# Patient Record
Sex: Male | Born: 1950 | Race: White | Hispanic: No | Smoking: Former smoker
Health system: Southern US, Community
[De-identification: ages and names within clinical notes are randomized; demographics above are authoritative.]

## PROBLEM LIST (undated history)

## (undated) DIAGNOSIS — J449 Chronic obstructive pulmonary disease, unspecified: Secondary | ICD-10-CM

## (undated) DIAGNOSIS — I10 Essential (primary) hypertension: Secondary | ICD-10-CM

## (undated) DIAGNOSIS — I4891 Unspecified atrial fibrillation: Secondary | ICD-10-CM

## (undated) DIAGNOSIS — I499 Cardiac arrhythmia, unspecified: Secondary | ICD-10-CM

## (undated) DIAGNOSIS — R7303 Prediabetes: Secondary | ICD-10-CM

## (undated) DIAGNOSIS — E785 Hyperlipidemia, unspecified: Secondary | ICD-10-CM

## (undated) DIAGNOSIS — I495 Sick sinus syndrome: Secondary | ICD-10-CM

## (undated) DIAGNOSIS — Z950 Presence of cardiac pacemaker: Secondary | ICD-10-CM

## (undated) HISTORY — DX: Hyperlipidemia, unspecified: E78.5

## (undated) HISTORY — PX: OTHER SURGICAL HISTORY: SHX169

## (undated) HISTORY — DX: Sick sinus syndrome: I49.5

## (undated) HISTORY — DX: Essential (primary) hypertension: I10

---

## 2001-11-05 ENCOUNTER — Emergency Department (HOSPITAL_COMMUNITY): Admission: EM | Admit: 2001-11-05 | Discharge: 2001-11-05 | Payer: Self-pay | Admitting: Emergency Medicine

## 2009-09-03 ENCOUNTER — Ambulatory Visit (HOSPITAL_COMMUNITY): Admission: RE | Admit: 2009-09-03 | Discharge: 2009-09-03 | Payer: Self-pay | Admitting: Pediatrics

## 2011-03-02 ENCOUNTER — Other Ambulatory Visit (HOSPITAL_COMMUNITY): Payer: Self-pay | Admitting: Pediatrics

## 2011-03-02 DIAGNOSIS — R52 Pain, unspecified: Secondary | ICD-10-CM

## 2011-03-10 ENCOUNTER — Ambulatory Visit (HOSPITAL_COMMUNITY)
Admission: RE | Admit: 2011-03-10 | Discharge: 2011-03-10 | Disposition: A | Discharge status: Home / Self Care | Payer: 59 | Source: Ambulatory Visit | Attending: Pediatrics | Admitting: Pediatrics

## 2011-03-10 DIAGNOSIS — M25519 Pain in unspecified shoulder: Secondary | ICD-10-CM | POA: Insufficient documentation

## 2011-03-10 DIAGNOSIS — R52 Pain, unspecified: Secondary | ICD-10-CM

## 2011-03-10 DIAGNOSIS — M542 Cervicalgia: Secondary | ICD-10-CM | POA: Insufficient documentation

## 2011-03-10 DIAGNOSIS — M5020 Other cervical disc displacement, unspecified cervical region: Secondary | ICD-10-CM | POA: Insufficient documentation

## 2015-08-13 ENCOUNTER — Telehealth: Payer: Self-pay

## 2015-08-13 NOTE — Telephone Encounter (Signed)
I called pt and he has never had a colonoscopy. Recently had blood in stool. Ov with Walden Field, NP on 08/29/2015 at 8:30 Am.

## 2015-08-13 NOTE — Telephone Encounter (Signed)
Patient received letter from DS to be set up for colonoscopy. Please call him back at [phone]

## 2015-08-29 ENCOUNTER — Other Ambulatory Visit: Payer: Self-pay

## 2015-08-29 ENCOUNTER — Ambulatory Visit (INDEPENDENT_AMBULATORY_CARE_PROVIDER_SITE_OTHER): Payer: Commercial Managed Care - HMO | Admitting: Nurse Practitioner

## 2015-08-29 ENCOUNTER — Encounter: Payer: Self-pay | Admitting: Nurse Practitioner

## 2015-08-29 VITALS — BP 188/91 | HR 89 | Temp 97.9°F | Ht 75.0 in | Wt 220.2 lb

## 2015-08-29 DIAGNOSIS — Z1211 Encounter for screening for malignant neoplasm of colon: Secondary | ICD-10-CM | POA: Diagnosis not present

## 2015-08-29 DIAGNOSIS — K625 Hemorrhage of anus and rectum: Secondary | ICD-10-CM

## 2015-08-29 MED ORDER — NA SULFATE-K SULFATE-MG SULF 17.5-3.13-1.6 GM/177ML PO SOLN: 1.0000 | Freq: Once | ORAL | Status: DC

## 2015-08-29 NOTE — Assessment & Plan Note (Signed)
Patient initially presented for well overdue first-ever screening colonoscopy. However it was noted that he was having rectal bleeding as noted above. We'll proceed with colonoscopy for further evaluation as noted above.

## 2015-08-29 NOTE — Progress Notes (Signed)
Primary Care Physician:  Wende Neighbors, MD Primary Gastroenterologist:  Dr. Oneida Alar  Chief Complaint  Patient presents with  . Colonoscopy    HPI:   65 year old male presents on referral from PCP for screening colonoscopy. PCP notes reviewed. Patient complained of rectal bleeding 3 weeks prior to attempted phone triage and was therefore scheduled for an appointment.  Today he states he has never had a colonoscopy before. Thought he saw rectal bleeding in the summer and started paying attention. Did have bleeding noted on 07/24/15 and 08/16/15. Blood was bright red, on the toilet, minimal to moderate amount. Had hemorrhoids 20 years ago without recurrence since, specifically when having bleeding no symptoms of hemorrhoid flare. Denies abdominal pain, N/V, melena, change in bowel habits. Has a BM daily in the morning, consistent with [place] 4, no straining. Denies fever, chills, unintentional weight loss. Denies chest pain, dyspnea, dizziness, lightheadedness, syncope, near syncope. Denies any other upper or lower GI symptoms.   He is somewhat hypertensive today. Checks his BP twice a day and typically runs 110-140/70-85. States he is alyways hypertensive in the provider office.  No past medical history on file.  No past surgical history on file.  Current Outpatient Prescriptions  Medication Sig Dispense Refill  . aspirin 81 MG tablet Take 81 mg by mouth daily.    . CHROMIUM-CINNAMON PO Take 2,000 mg by mouth 2 (two) times daily.    . Coenzyme Q10 (CO Q 10) 100 MG CAPS Take 100 capsules by mouth daily.    Marland Kitchen losartan (COZAAR) 100 MG tablet Take 100 mg by mouth daily.   0  . lovastatin (MEVACOR) 10 MG tablet 10 mg at bedtime.   0  . Multiple Vitamin (MULTIVITAMIN) capsule Take 1 capsule by mouth daily.    Marland Kitchen UNABLE TO FIND daily. [place] daily     No current facility-administered medications for this visit.    Allergies as of 08/29/2015  . (No Known Allergies)    No  family history on file.  Social History   Social History  . Marital Status: Widowed    Spouse Name: N/A  . Number of Children: N/A  . Years of Education: N/A   Occupational History  . Not on file.   Social History Main Topics  . Smoking status: Former Smoker -- 1.00 packs/day    Types: Cigarettes    Quit date: 08/28/2010  . Smokeless tobacco: Not on file  . Alcohol Use: No  . Drug Use: No  . Sexual Activity: Not on file   Other Topics Concern  . Not on file   Social History Narrative  . No narrative on file    Review of Systems: 10-point ROS negative except as per HPI.    Physical Exam: BP 188/91 mmHg  Pulse 89  Temp(Src) 97.9 F (36.6 C) (Oral)  Ht 6\' 3"  (1.905 m)  Wt 220 lb 3.2 oz (99.882 kg)  BMI 27.52 kg/m2 General:   Alert and oriented. Pleasant and cooperative. Well-nourished and well-developed.  Head:  Normocephalic and atraumatic. Eyes:  Without icterus, sclera clear and conjunctiva pink.  Ears:  Normal auditory acuity. Cardiovascular:  S1, S2 present without murmurs appreciated. Extremities without clubbing or edema. Respiratory:  Clear to auscultation bilaterally. No wheezes, rales, or rhonchi. No distress.  Gastrointestinal:  +BS, soft, non-tender and non-distended. No HSM noted. No guarding or rebound. No masses appreciated.  Rectal:  Deferred  Neurologic:  Alert and oriented x4;  grossly normal neurologically.  Psych:  Alert and cooperative. Normal mood and affect.    08/29/2015 8:57 AM

## 2015-08-29 NOTE — Assessment & Plan Note (Signed)
65 year old man patient who has never had a colonoscopy presents for screening colonoscopy. He has had 2 episodes of mild to moderate hematochezia within the past month. Possibly an episode earlier in the summer as well. At this point he is well overdue for colonoscopy and we'll proceed with colonoscopy for further evaluation. Otherwise, he is asymptomatic from a GI standpoint. No red flag/warning signs or symptoms.  Proceed with colonoscopy with Dr. Oneida Alar in the near future. The risks, benefits, and alternatives have been discussed in detail with the patient. They state understanding and desire to proceed.   Patient is not on any anticoagulants, anxiolytics, chronic pain medications, or antidepressants. Conscious sedation should be adequate for his procedure.

## 2015-08-29 NOTE — Patient Instructions (Signed)
1. We will schedule your procedure for you. 2. Further recommendations to be based on the results of your procedure. 3. Return for follow-up as needed for any stomach or colon issues.

## 2015-08-29 NOTE — Progress Notes (Signed)
CC'ED TO PCP 

## 2015-09-16 ENCOUNTER — Ambulatory Visit (HOSPITAL_COMMUNITY)
Admission: RE | Admit: 2015-09-16 | Discharge: 2015-09-16 | Disposition: A | Discharge status: Home / Self Care | Payer: Commercial Managed Care - HMO | Source: Ambulatory Visit | Attending: Gastroenterology | Admitting: Gastroenterology

## 2015-09-16 ENCOUNTER — Encounter (HOSPITAL_COMMUNITY): Payer: Self-pay | Admitting: *Deleted

## 2015-09-16 ENCOUNTER — Encounter (HOSPITAL_COMMUNITY)
Admission: RE | Disposition: A | Discharge status: Home / Self Care | Payer: Self-pay | Source: Ambulatory Visit | Attending: Gastroenterology

## 2015-09-16 DIAGNOSIS — D124 Benign neoplasm of descending colon: Secondary | ICD-10-CM | POA: Diagnosis not present

## 2015-09-16 DIAGNOSIS — E785 Hyperlipidemia, unspecified: Secondary | ICD-10-CM | POA: Diagnosis not present

## 2015-09-16 DIAGNOSIS — D127 Benign neoplasm of rectosigmoid junction: Secondary | ICD-10-CM | POA: Insufficient documentation

## 2015-09-16 DIAGNOSIS — K5730 Diverticulosis of large intestine without perforation or abscess without bleeding: Secondary | ICD-10-CM | POA: Diagnosis not present

## 2015-09-16 DIAGNOSIS — Z7982 Long term (current) use of aspirin: Secondary | ICD-10-CM | POA: Diagnosis not present

## 2015-09-16 DIAGNOSIS — Z1211 Encounter for screening for malignant neoplasm of colon: Secondary | ICD-10-CM

## 2015-09-16 DIAGNOSIS — K921 Melena: Secondary | ICD-10-CM | POA: Insufficient documentation

## 2015-09-16 DIAGNOSIS — K625 Hemorrhage of anus and rectum: Secondary | ICD-10-CM | POA: Diagnosis not present

## 2015-09-16 DIAGNOSIS — Z79899 Other long term (current) drug therapy: Secondary | ICD-10-CM | POA: Diagnosis not present

## 2015-09-16 DIAGNOSIS — I10 Essential (primary) hypertension: Secondary | ICD-10-CM | POA: Diagnosis not present

## 2015-09-16 DIAGNOSIS — K648 Other hemorrhoids: Secondary | ICD-10-CM | POA: Insufficient documentation

## 2015-09-16 HISTORY — PX: COLONOSCOPY: SHX5424

## 2015-09-16 SURGERY — COLONOSCOPY
Anesthesia: Moderate Sedation

## 2015-09-16 MED ORDER — MEPERIDINE HCL 100 MG/ML IJ SOLN
INTRAMUSCULAR | Status: AC
  Filled 2015-09-16: qty 2

## 2015-09-16 MED ORDER — SPOT INK MARKER SYRINGE KIT
PACK | SUBMUCOSAL | Status: DC | PRN
  Administered 2015-09-16: 5 mL via SUBMUCOSAL

## 2015-09-16 MED ORDER — SODIUM CHLORIDE 0.9 % IV SOLN
INTRAVENOUS | Status: DC
  Administered 2015-09-16: 12:00:00 via INTRAVENOUS

## 2015-09-16 MED ORDER — MIDAZOLAM HCL 5 MG/5ML IJ SOLN
INTRAMUSCULAR | Status: DC | PRN
  Administered 2015-09-16 (×3): 2 mg via INTRAVENOUS

## 2015-09-16 MED ORDER — MIDAZOLAM HCL 5 MG/5ML IJ SOLN
INTRAMUSCULAR | Status: AC
  Filled 2015-09-16: qty 10

## 2015-09-16 MED ORDER — EPINEPHRINE HCL 0.1 MG/ML IJ SOSY
PREFILLED_SYRINGE | INTRAMUSCULAR | Status: AC
  Filled 2015-09-16: qty 10

## 2015-09-16 MED ORDER — SIMETHICONE 40 MG/0.6ML PO SUSP
ORAL | Status: DC | PRN
  Administered 2015-09-16: 12:00:00

## 2015-09-16 MED ORDER — MEPERIDINE HCL 100 MG/ML IJ SOLN
INTRAMUSCULAR | Status: DC | PRN
  Administered 2015-09-16 (×3): 25 mg via INTRAVENOUS

## 2015-09-16 NOTE — Op Note (Addendum)
[facility] [address] [place], [address]   COLONOSCOPY PROCEDURE REPORT  PATIENT: Ruben Lee, Ruben Lee  MR#: [id number] BIRTHDATE: Nov 07, 1950 , 64  yrs. old GENDER: male ENDOSCOPIST: Danie Binder, MD REFERRED EO:[id number] Hall, M.D. PROCEDURE DATE:  October 06, 2015 PROCEDURE:   Colonoscopy with snare polypectomy and Submucosal injection: EPI(2 CC), SPOT(1 CC) INDICATIONS:hematochezia. MEDICATIONS: Demerol 75 mg IV and Versed 6 mg IV  MD INITIATED: 1212. PROCEDURE COMPLETE: 1251  DESCRIPTION OF PROCEDURE:    Physical exam was performed.  Informed consent was obtained from the patient after explaining the benefits, risks, and alternatives to procedure.  The patient was connected to monitor and placed in left lateral position. Continuous oxygen was provided by nasal cannula and IV medicine administered through an indwelling cannula.  After administration of sedation and rectal exam, the patients rectum was intubated and the [id number]  colonoscope was advanced under direct visualization to the ileum.  The scope was removed slowly by carefully examining the color, texture, anatomy, and integrity mucosa on the way out.  The patient was recovered in endoscopy and discharged home in satisfactory condition. Estimated blood loss is zero unless otherwise noted in this procedure report.    COLON FINDINGS: The examined terminal ileum appeared to be normal. , A sessile polyp measuring 6 mm in size was found in the descending colon.  A polypectomy was performed using snare cautery. , A pedunculated polyp measuring 12 mm in size was found in the rectosigmoid colon.  A polypectomy was performed using snare cautery.  The resection was complete, the polyp tissue was completely retrieved and sent to histology.  A 1:10,000 Epinephrine solution injection was given to control bleeding.  A tattoo was applied.  , There was mild diverticulosis noted in the sigmoid colon with  associated muscular hypertrophy.  , and Large internal hemorrhoids were found.  PREP QUALITY: excellent. CECAL W/D TIME: 19       minutes COMPLICATIONS: None  ENDOSCOPIC IMPRESSION: 1.   RECTAL BLEEDING DUE TO LARGE INTERNAL HEMORRHOIDS 2.   ONESessile DESCENDING COLON polyp REMOVED 3.   ONE Pedunculated RECTOSIGMOID COLON polyp 4.   Mild diverticulosis in the sigmoid colon  RECOMMENDATIONS: NO MRI FOR 30 DAYS. DRINK WATER TO KEEP YOUR URINE LIGHT YELLOW. FOLLOW A HIGH FIBER DIET. AWAIT BIOPSY RESULTS. Next colonoscopy in 1-3 years.   _______________________________ Lorrin MaisDanie Binder, MD 10/06/15 1:26 PM Revised: 10-06-15 1:26 PM  CPT CODES: ICD CODES:  The ICD and CPT codes recommended by this software are interpretations from the data that the clinical staff has captured with the software.  The verification of the translation of this report to the ICD and CPT codes and modifiers is the sole responsibility of the health care institution and practicing physician where this report was generated.  [employer] will not be held responsible for the validity of the ICD and CPT codes included on this report.  AMA assumes no liability for data contained or not contained herein. CPT is a Designer, television/film set of the [employer].

## 2015-09-16 NOTE — Discharge Instructions (Signed)
YOUR RECTAL BLEEDING IS MOST LIKELY DUE TO LARGE INTERNAL HEMORRHOIDS. You had 2 polyps removed. ONE WAS LARGER AND I TATTOOED THE BASE. I PLACED A CLIP TO PREVENT BLEEDING IN 7-10 DAYS. You have Diverticulosis IN YOUR LEFT COLON.   NO MRI FOR 30 DAYS DUE TO METAL CLIPS PLACED IN THE COLON.  DRINK WATER TO KEEP YOUR URINE LIGHT YELLOW.  FOLLOW A HIGH FIBER DIET. AVOID ITEMS THAT CAUSE BLOATING. SEE INFO BELOW.  YOUR BIOPSY RESULTS WILL BE AVAILABLE IN MY CHART FEB 2 AND MY OFFICE WILL CONTACT YOU IN 10-14 DAYS WITH YOUR RESULTS.   Next colonoscopy in 1-3 years.    Colonoscopy Care After Read the instructions outlined below and refer to this sheet in the next week. These discharge instructions provide you with general information on caring for yourself after you leave the hospital. While your treatment has been planned according to the most current medical practices available, unavoidable complications occasionally occur. If you have any problems or questions after discharge, call DR. Elleanna Melling, [phone].  ACTIVITY  You may resume your regular activity, but move at a slower pace for the next 24 hours.   Take frequent rest periods for the next 24 hours.   Walking will help get rid of the air and reduce the bloated feeling in your belly (abdomen).   No driving for 24 hours (because of the medicine (anesthesia) used during the test).   You may shower.   Do not sign any important legal documents or operate any machinery for 24 hours (because of the anesthesia used during the test).    NUTRITION  Drink plenty of fluids.   You may resume your normal diet as instructed by your doctor.   Begin with a light meal and progress to your normal diet. Heavy or fried foods are harder to digest and may make you feel sick to your stomach (nauseated).   Avoid alcoholic beverages for 24 hours or as instructed.    MEDICATIONS  You may resume your normal medications.   WHAT YOU CAN EXPECT  TODAY  Some feelings of bloating in the abdomen.   Passage of more gas than usual.   Spotting of blood in your stool or on the toilet paper  .  IF YOU HAD POLYPS REMOVED DURING THE COLONOSCOPY:  Eat a soft diet IF YOU HAVE NAUSEA, BLOATING, ABDOMINAL PAIN, OR VOMITING.    FINDING OUT THE RESULTS OF YOUR TEST Not all test results are available during your visit. DR. Oneida Alar WILL CALL YOU WITHIN 14 DAYS OF YOUR PROCEDUE WITH YOUR RESULTS. Do not assume everything is normal if you have not heard from DR. Jamesen Stahnke, CALL HER OFFICE AT [phone].  SEEK IMMEDIATE MEDICAL ATTENTION AND CALL THE OFFICE: [phone] IF:  You have more than a spotting of blood in your stool.   Your belly is swollen (abdominal distention).   You are nauseated or vomiting.   You have a temperature over 101F.   You have abdominal pain or discomfort that is severe or gets worse throughout the day.  Polyps, Colon  A polyp is extra tissue that grows inside your body. Colon polyps grow in the large intestine. The large intestine, also called the colon, is part of your digestive system. It is a long, hollow tube at the end of your digestive tract where your body makes and stores stool. Most polyps are not dangerous. They are benign. This means they are not cancerous. But over time, some types of polyps  can turn into cancer. Polyps that are smaller than a pea are usually not harmful. But larger polyps could someday become or may already be cancerous. To be safe, doctors remove all polyps and test them.   WHO GETS POLYPS? Anyone can get polyps, but certain people are more likely than others. You may have a greater chance of getting polyps if:  You are over 50.   You have had polyps before.   Someone in your family has had polyps.   Someone in your family has had cancer of the large intestine.   Find out if someone in your family has had polyps. You may also be more likely to get polyps if you:   Eat a lot  of fatty foods   Smoke   Drink alcohol   Do not exercise  Eat too much   PREVENTION There is not one sure way to prevent polyps. You might be able to lower your risk of getting them if you:  Eat more fruits and vegetables and less fatty food.   Do not smoke.   Avoid alcohol.   Exercise every day.   Lose weight if you are overweight.   Eating more calcium and folate can also lower your risk of getting polyps. Some foods that are rich in calcium are milk, cheese, and broccoli. Some foods that are rich in folate are chickpeas, kidney beans, and spinach.   High-Fiber Diet A high-fiber diet changes your normal diet to include more whole grains, legumes, fruits, and vegetables. Changes in the diet involve replacing refined carbohydrates with unrefined foods. The calorie level of the diet is essentially unchanged. The Dietary Reference Intake (recommended amount) for adult males is 38 grams per day. For adult females, it is 25 grams per day. Pregnant and lactating women should consume 28 grams of fiber per day. Fiber is the intact part of a plant that is not broken down during digestion. Functional fiber is fiber that has been isolated from the plant to provide a beneficial effect in the body. PURPOSE  Increase stool bulk.   Ease and regulate bowel movements.   Lower cholesterol.  REDUCE RISK OF COLON CANCER  INDICATIONS THAT YOU NEED MORE FIBER  Constipation and hemorrhoids.   Uncomplicated diverticulosis (intestine condition) and irritable bowel syndrome.   Weight management.   As a protective measure against hardening of the arteries (atherosclerosis), diabetes, and cancer.   GUIDELINES FOR INCREASING FIBER IN THE DIET  Start adding fiber to the diet slowly. A gradual increase of about 5 more grams (2 slices of whole-wheat bread, 2 servings of most fruits or vegetables, or 1 bowl of high-fiber cereal) per day is best. Too rapid an increase in fiber may result in constipation,  flatulence, and bloating.   Drink enough water and fluids to keep your urine clear or pale yellow. Water, juice, or caffeine-free drinks are recommended. Not drinking enough fluid may cause constipation.   Eat a variety of high-fiber foods rather than one type of fiber.   Try to increase your intake of fiber through using high-fiber foods rather than fiber pills or supplements that contain small amounts of fiber.   The goal is to change the types of food eaten. Do not supplement your present diet with high-fiber foods, but replace foods in your present diet.   INCLUDE A VARIETY OF FIBER SOURCES  Replace refined and processed grains with whole grains, canned fruits with fresh fruits, and incorporate other fiber sources. White rice, white  breads, and most bakery goods contain little or no fiber.   Brown whole-grain rice, buckwheat oats, and many fruits and vegetables are all good sources of fiber. These include: broccoli, Brussels sprouts, cabbage, cauliflower, beets, sweet potatoes, white potatoes (skin on), carrots, tomatoes, eggplant, squash, berries, fresh fruits, and dried fruits.   Cereals appear to be the richest source of fiber. Cereal fiber is found in whole grains and bran. Bran is the fiber-rich outer coat of cereal grain, which is largely removed in refining. In whole-grain cereals, the bran remains. In breakfast cereals, the largest amount of fiber is found in those with "bran" in their names. The fiber content is sometimes indicated on the label.   You may need to include additional fruits and vegetables each day.   In baking, for 1 cup white flour, you may use the following substitutions:   1 cup whole-wheat flour minus 2 tablespoons.   1/2 cup white flour plus 1/2 cup whole-wheat flour.   Diverticulosis Diverticulosis is a common condition that develops when small pouches (diverticula) form in the wall of the colon. The risk of diverticulosis increases with age. It happens  more often in people who eat a low-fiber diet. Most individuals with diverticulosis have no symptoms. Those individuals with symptoms usually experience belly (abdominal) pain, constipation, or loose stools (diarrhea).  HOME CARE INSTRUCTIONS  Increase the amount of fiber in your diet as directed by your caregiver or dietician. This may reduce symptoms of diverticulosis.   Drink at least 6 to 8 glasses of water each day to prevent constipation.   Try not to strain when you have a bowel movement.   Avoiding nuts and seeds to prevent complications is NOT NECESSARY.   FOODS HAVING HIGH FIBER CONTENT INCLUDE:  Fruits. Apple, peach, pear, tangerine, raisins, prunes.   Vegetables. Brussels sprouts, asparagus, broccoli, cabbage, carrot, cauliflower, romaine lettuce, spinach, summer squash, tomato, winter squash, zucchini.   Starchy Vegetables. Baked beans, kidney beans, lima beans, split peas, lentils, potatoes (with skin).   Grains. Whole wheat bread, brown rice, bran flake cereal, plain oatmeal, white rice, shredded wheat, bran muffins.    SEEK IMMEDIATE MEDICAL CARE IF:  You develop increasing pain or severe bloating.   You have an oral temperature above 101F.   You develop vomiting or bowel movements that are bloody or black.   Hemorrhoids Hemorrhoids are dilated (enlarged) veins around the rectum. Sometimes clots will form in the veins. This makes them swollen and painful. These are called thrombosed hemorrhoids. Causes of hemorrhoids include:  Constipation.   Straining to have a bowel movement.   HEAVY LIFTING  HOME CARE INSTRUCTIONS  Eat a well balanced diet and drink 6 to 8 glasses of water every day to avoid constipation. You may also use a bulk laxative.   Avoid straining to have bowel movements.   Keep anal area dry and clean.   Do not use a donut shaped pillow or sit on the toilet for long periods. This increases blood pooling and pain.   Move your bowels when  your body has the urge; this will require less straining and will decrease pain and pressure.

## 2015-09-16 NOTE — H&P (Signed)
  Primary Care Physician:  Wende Neighbors, MD Primary Gastroenterologist:  Dr. Oneida Alar  Pre-Procedure History & Physical: HPI:  Ruben Lee is a 65 y.o. male here for  BRBPR.  Past Medical History  Diagnosis Date  . Hypertension   . Hyperlipidemia     Past Surgical History  Procedure Laterality Date  . None to date      As of 08/29/15    Prior to Admission medications   Medication Sig Start Date End Date Taking? Authorizing Provider  aspirin 81 MG tablet Take 81 mg by mouth daily.   Yes Historical Provider, MD  CHROMIUM-CINNAMON PO Take 2,000 mg by mouth 2 (two) times daily.   Yes Historical Provider, MD  Coenzyme Q10 (CO Q 10) 100 MG CAPS Take 100 capsules by mouth daily.   Yes Historical Provider, MD  ibuprofen (ADVIL,MOTRIN) 200 MG tablet Take 200 mg by mouth every 6 (six) hours as needed.   Yes Historical Provider, MD  losartan (COZAAR) 100 MG tablet Take 100 mg by mouth daily.  08/14/15  Yes Historical Provider, MD  lovastatin (MEVACOR) 10 MG tablet 10 mg at bedtime.  08/14/15  Yes Historical Provider, MD  Multiple Vitamin (MULTIVITAMIN) capsule Take 1 capsule by mouth daily.   Yes Historical Provider, MD  Na Sulfate-K Sulfate-Mg Sulf SOLN Take 1 kit by mouth once. 08/29/15 09/28/15 Yes Carlis Stable, NP  UNABLE TO FIND daily. [place] daily   Yes Historical Provider, MD    Allergies as of 08/29/2015  . (No Known Allergies)    Family History  Problem Relation Age of Onset  . Colon cancer Cousin     "maybe a distant cousin"    Social History   Social History  . Marital Status: Widowed    Spouse Name: N/A  . Number of Children: N/A  . Years of Education: N/A   Occupational History  . Not on file.   Social History Main Topics  . Smoking status: Former Smoker -- 1.00 packs/day for 43 years    Types: Cigarettes    Quit date: 07/18/2010  . Smokeless tobacco: Never Used  . Alcohol Use: No     Comment: None in 20 years  . Drug Use: No  . Sexual  Activity: Not on file   Other Topics Concern  . Not on file   Social History Narrative    Review of Systems: See HPI, otherwise negative ROS   Physical Exam: BP 168/103 mmHg  Pulse 100  Temp(Src) 97.6 F (36.4 C) (Oral)  Resp 13  Ht 6' 3" (1.905 m)  Wt 220 lb (99.791 kg)  BMI 27.50 kg/m2  SpO2 96% General:   Alert,  pleasant and cooperative in NAD Head:  Normocephalic and atraumatic. Neck:  Supple; Lungs:  Clear throughout to auscultation.    Heart:  Regular rate and rhythm. Abdomen:  Soft, nontender and nondistended. Normal bowel sounds, without guarding, and without rebound.   Neurologic:  Alert and  oriented x4;  grossly normal neurologically.  Impression/Plan:    BRBPR  PLAN: TCS TODAY

## 2015-09-16 NOTE — Progress Notes (Signed)
REVIEWED-NO ADDITIONAL RECOMMENDATIONS. 

## 2015-09-18 ENCOUNTER — Encounter (HOSPITAL_COMMUNITY): Payer: Self-pay | Admitting: Gastroenterology

## 2015-09-30 ENCOUNTER — Telehealth: Payer: Self-pay | Admitting: Gastroenterology

## 2015-09-30 NOTE — Telephone Encounter (Signed)
Please call pt. HE had simple adenomas removed.  NO MRI UNTIL MAR 2 DUE TO METAL CLIPS PLACED IN THE COLON.  DRINK WATER TO KEEP YOUR URINE LIGHT YELLOW.  FOLLOW A HIGH FIBER DIET. AVOID ITEMS THAT CAUSE BLOATING.   Next colonoscopy in 3 years.

## 2015-10-01 NOTE — Telephone Encounter (Signed)
Reminder in epic °

## 2015-10-01 NOTE — Telephone Encounter (Signed)
Pt is aware of results. 

## 2016-01-29 ENCOUNTER — Ambulatory Visit (INDEPENDENT_AMBULATORY_CARE_PROVIDER_SITE_OTHER): Payer: Commercial Managed Care - HMO | Admitting: Orthopaedic Surgery

## 2016-01-29 ENCOUNTER — Encounter: Payer: Self-pay | Admitting: Orthopaedic Surgery

## 2016-01-29 ENCOUNTER — Ambulatory Visit (INDEPENDENT_AMBULATORY_CARE_PROVIDER_SITE_OTHER): Payer: Commercial Managed Care - HMO

## 2016-01-29 VITALS — BP 164/106 | HR 77 | Temp 98.4°F | Ht 74.0 in | Wt 214.6 lb

## 2016-01-29 DIAGNOSIS — M25561 Pain in right knee: Secondary | ICD-10-CM | POA: Diagnosis not present

## 2016-01-29 NOTE — Progress Notes (Signed)
Subjective:  My right knee hurts    Patient ID: Ruben Lee, male    DOB: June 09, 1951, 65 y.o.   MRN: [id number]  Knee Pain  The incident occurred more than 1 week ago. There was no injury mechanism. The pain is present in the right knee. The quality of the pain is described as aching. The pain is at a severity of 3/10. The pain is mild. The pain has been fluctuating since onset. Associated symptoms include a loss of motion. Pertinent negatives include no inability to bear weight, loss of sensation, muscle weakness, numbness or tingling. The symptoms are aggravated by weight bearing. He has tried ice and rest for the symptoms. The treatment provided moderate relief.   He has several month history of right knee pain and swelling.  He saw Dr. Delphina Cahill about this in late March of this year.  He has seen Dr. Nevada Crane again.  His right knee has periods of swelling and pain and popping. He has had giving way more recently.  He has good and bad days.  But overall it is getting worse.  He has tired rest, ice, heat, knee brace with little help.  He has no trauma and no other joint pains.   Review of Systems  HENT: Negative for congestion.   Respiratory: Negative for cough and shortness of breath.   Cardiovascular: Negative for chest pain and leg swelling.  Endocrine: Negative for cold intolerance.  Musculoskeletal: Positive for joint swelling, arthralgias and gait problem.  Allergic/Immunologic: Negative for environmental allergies.  Neurological: Negative for tingling and numbness.   Past Medical History  Diagnosis Date  . Hypertension   . Hyperlipidemia     Past Surgical History  Procedure Laterality Date  . None to date      As of 08/29/15  . Colonoscopy N/A 09/16/2015    Procedure: COLONOSCOPY;  Surgeon: Danie Binder, MD;  Location: [facility];  Service: Endoscopy;  Laterality: N/A;  1230pm    Current Outpatient Prescriptions on File Prior to Visit  Medication Sig Dispense  Refill  . aspirin 81 MG tablet Take 81 mg by mouth daily.    . CHROMIUM-CINNAMON PO Take 2,000 mg by mouth 2 (two) times daily.    . Coenzyme Q10 (CO Q 10) 100 MG CAPS Take 100 capsules by mouth daily.    Marland Kitchen ibuprofen (ADVIL,MOTRIN) 200 MG tablet Take 200 mg by mouth every 6 (six) hours as needed.    Marland Kitchen losartan (COZAAR) 100 MG tablet Take 100 mg by mouth daily.   0  . lovastatin (MEVACOR) 10 MG tablet 10 mg at bedtime.   0  . Multiple Vitamin (MULTIVITAMIN) capsule Take 1 capsule by mouth daily.    Marland Kitchen UNABLE TO FIND daily. [place] daily     No current facility-administered medications on file prior to visit.    Social History   Social History  . Marital Status: Widowed    Spouse Name: N/A  . Number of Children: N/A  . Years of Education: N/A   Occupational History  . Not on file.   Social History Main Topics  . Smoking status: Former Smoker -- 1.00 packs/day for 43 years    Types: Cigarettes    Quit date: 07/18/2010  . Smokeless tobacco: Never Used  . Alcohol Use: No     Comment: None in 20 years  . Drug Use: No  . Sexual Activity: Not on file   Other Topics Concern  .  Not on file   Social History Narrative    BP 164/106 mmHg  Pulse 77  Temp(Src) 98.4 F (36.9 C)  Ht 6\' 2"  (1.88 m)  Wt 214 lb 9.6 oz (97.342 kg)  BMI 27.54 kg/m2     Objective:   Physical Exam  Constitutional: He is oriented to person, place, and time. He appears well-developed and well-nourished.  HENT:  Head: Normocephalic and atraumatic.  Eyes: Conjunctivae and EOM are normal. Pupils are equal, round, and reactive to light.  Neck: Normal range of motion. Neck supple.  Cardiovascular: Normal rate, regular rhythm and intact distal pulses.   Pulmonary/Chest: Effort normal.  Abdominal: Soft.  Musculoskeletal: He exhibits tenderness (Right knee with effusion, pain medial joint line, positive medial McMurray, ROM 0 to 110 with crepitus, limp to the right.  Left knee negative.).   Neurological: He is alert and oriented to person, place, and time. He has normal reflexes. No cranial nerve deficit. He exhibits normal muscle tone. Coordination normal.  Skin: Skin is warm and dry.  Psychiatric: He has a normal mood and affect. His behavior is normal. Judgment and thought content normal.  He has some "play" of the Lachman and Drawer on the right of about a 1/2+.  X-rays were done, reported separately.      Assessment & Plan:   Encounter Diagnosis  Name Primary?  . Right knee pain Yes   I have scheduled him for a MRI of the right knee.  He has giving way, right knee medial pain, positive right medial McMurray, effusion.  I am concerned about a medial meniscus tear.  Return after the MRI.  Call if any problem.  Precautions given.  Electronically Signed Sanjuana Kava, MD 6/14/201711:33 AM

## 2016-02-06 ENCOUNTER — Ambulatory Visit (HOSPITAL_COMMUNITY)
Admission: RE | Admit: 2016-02-06 | Discharge: 2016-02-06 | Disposition: A | Discharge status: Home / Self Care | Payer: Commercial Managed Care - HMO | Source: Ambulatory Visit | Attending: Orthopaedic Surgery | Admitting: Orthopaedic Surgery

## 2016-02-06 DIAGNOSIS — X58XXXA Exposure to other specified factors, initial encounter: Secondary | ICD-10-CM | POA: Insufficient documentation

## 2016-02-06 DIAGNOSIS — M7121 Synovial cyst of popliteal space [Baker], right knee: Secondary | ICD-10-CM | POA: Insufficient documentation

## 2016-02-06 DIAGNOSIS — S83241A Other tear of medial meniscus, current injury, right knee, initial encounter: Secondary | ICD-10-CM | POA: Diagnosis not present

## 2016-02-06 DIAGNOSIS — M25561 Pain in right knee: Secondary | ICD-10-CM | POA: Insufficient documentation

## 2016-02-06 DIAGNOSIS — M25461 Effusion, right knee: Secondary | ICD-10-CM | POA: Insufficient documentation

## 2016-02-06 DIAGNOSIS — M84361A Stress fracture, right tibia, initial encounter for fracture: Secondary | ICD-10-CM | POA: Diagnosis not present

## 2016-02-11 ENCOUNTER — Ambulatory Visit (INDEPENDENT_AMBULATORY_CARE_PROVIDER_SITE_OTHER): Payer: Commercial Managed Care - HMO | Admitting: Orthopaedic Surgery

## 2016-02-11 ENCOUNTER — Encounter: Payer: Self-pay | Admitting: Orthopaedic Surgery

## 2016-02-11 VITALS — BP 165/83 | HR 77 | Temp 97.7°F | Ht 75.0 in | Wt 216.0 lb

## 2016-02-11 DIAGNOSIS — S82142A Displaced bicondylar fracture of left tibia, initial encounter for closed fracture: Secondary | ICD-10-CM | POA: Diagnosis not present

## 2016-02-11 DIAGNOSIS — S83241D Other tear of medial meniscus, current injury, right knee, subsequent encounter: Secondary | ICD-10-CM | POA: Diagnosis not present

## 2016-02-11 DIAGNOSIS — M25561 Pain in right knee: Secondary | ICD-10-CM

## 2016-02-11 MED ORDER — HYDROCODONE-ACETAMINOPHEN 7.5-325 MG PO TABS: 1.0000 | ORAL_TABLET | ORAL | Status: DC | PRN

## 2016-02-11 NOTE — Progress Notes (Signed)
Patient [id number] R Rivet, male DOB:1950/09/10, 65 y.o. [id number]  Chief Complaint  Patient presents with  . Follow-up    knee pain, MRI at [facility]     HPI  Ruben Lee is a 65 y.o. male who has right knee pain and had a MRI of the right knee.  It shows: IMPRESSION: 1. Unusually large flap tear of the posterior horn and midbody medial meniscus, with the large flap of meniscal tissue extending cephalad from the posterior horn in between the PCL and the medial femoral condyle. 2. Subcortical stress fracture in the medial tibial plateau with surrounding marrow edema. 3. 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 4. Small knee effusion. Small Baker's cyst, estimated volume 10 cc.  I have gone over the findings with him.  He will need arthroscopy of the right knee.  He will also need crutches for the right knee as he has a small stress fracture of the medial tibial plateau.  I have explained this as well.  This is the cause of most of his pain now I believe.  Rx for crutches.   HPI  Body mass index is 27 kg/(m^2).  ROS  Review of Systems  HENT: Negative for congestion.   Respiratory: Negative for cough and shortness of breath.   Cardiovascular: Negative for chest pain and leg swelling.  Endocrine: Negative for cold intolerance.  Musculoskeletal: Positive for joint swelling, arthralgias and gait problem.  Allergic/Immunologic: Negative for environmental allergies.  Neurological: Negative for numbness.    Past Medical History  Diagnosis Date  . Hypertension   . Hyperlipidemia     Past Surgical History  Procedure Laterality Date  . None to date      As of 08/29/15  . Colonoscopy N/A 09/16/2015    Procedure: COLONOSCOPY;  Surgeon: Danie Binder, MD;  Location: [facility];  Service: Endoscopy;  Laterality: N/A;  1230pm    Family History  Problem Relation Age of Onset  .  Colon cancer Cousin     "maybe a distant cousin"    Social History Social History  Substance Use Topics  . Smoking status: Former Smoker -- 1.00 packs/day for 43 years    Types: Cigarettes    Quit date: 07/18/2010  . Smokeless tobacco: Never Used  . Alcohol Use: No     Comment: None in 20 years    No Known Allergies  Current Outpatient Prescriptions  Medication Sig Dispense Refill  . aspirin 81 MG tablet Take 81 mg by mouth daily.    . CHROMIUM-CINNAMON PO Take 2,000 mg by mouth 2 (two) times daily.    . Coenzyme Q10 (CO Q 10) 100 MG CAPS Take 100 capsules by mouth daily.    Marland Kitchen ibuprofen (ADVIL,MOTRIN) 200 MG tablet Take 200 mg by mouth every 6 (six) hours as needed.    Marland Kitchen losartan (COZAAR) 100 MG tablet Take 100 mg by mouth daily.   0  . lovastatin (MEVACOR) 10 MG tablet 10 mg at bedtime.   0  . Multiple Vitamin (MULTIVITAMIN) capsule Take 1 capsule by mouth daily.    Marland Kitchen UNABLE TO FIND daily. [place] daily    . HYDROcodone-acetaminophen (NORCO) 7.5-325 MG tablet Take 1 tablet by mouth every 4 (four) hours as needed for moderate pain (Must last 30 days.  Do not drive or operate machinery while taking this medicine.). 120 tablet 0   No current facility-administered medications  for this visit.     Physical Exam  Blood pressure 165/83, pulse 77, temperature 97.7 F (36.5 C), height 6\' 3"  (1.905 m), weight 216 lb (97.977 kg).  Constitutional: overall normal hygiene, normal nutrition, well developed, normal grooming, normal body habitus. Assistive device:none  Musculoskeletal: gait and station Limp right, muscle tone and strength are normal, no tremors or atrophy is present.  .  Neurological: coordination overall normal.  Deep tendon reflex/nerve stretch intact.  Sensation normal.  Cranial nerves II-XII intact.   Skin:   normal overall no scars, lesions, ulcers or rashes. No psoriasis.  Psychiatric: Alert and oriented x 3.  Recent memory intact, remote memory  unclear.  Normal mood and affect. Well groomed.  Good eye contact.  Cardiovascular: overall no swelling, no varicosities, no edema bilaterally, normal temperatures of the legs and arms, no clubbing, cyanosis and good capillary refill.  Lymphatic: palpation is normal.  The right lower extremity is examined:  Inspection:  Thigh:  Non-tender and no defects  Knee has swelling 2+ effusion.                        Joint tenderness is present                        Patient is tender over the medial joint line  Lower Leg:  Has normal appearance and no tenderness or defects  Ankle:  Non-tender and no defects  Foot:  Non-tender and no defects Range of Motion:  Knee:  Range of motion is: 0-100                        Crepitus is  present  Ankle:  Range of motion is normal. Strength and Tone:  The right lower extremity has normal strength and tone. Stability:  Knee:  The knee has positive medial McMurray.  Ankle:  The ankle is stable.    The patient has been educated about the nature of the problem(s) and counseled on treatment options.  The patient appeared to understand what I have discussed and is in agreement with it.  Encounter Diagnoses  Name Primary?  . Right knee pain Yes  . Tear of medial meniscus of knee, right, subsequent encounter   . Fracture, tibial plateau, left, closed, initial encounter     PLAN Call if any problems.  Precautions discussed.  Continue current medications.   Return to clinic to see Dr. Aline Brochure for possible knee arthroscopy.  Needs to get crutches and stay off right lower leg.   Electronically Signed Sanjuana Kava, MD 6/27/201711:36 AM

## 2016-03-02 ENCOUNTER — Ambulatory Visit (INDEPENDENT_AMBULATORY_CARE_PROVIDER_SITE_OTHER): Payer: Commercial Managed Care - HMO | Admitting: Orthopedic Surgery

## 2016-03-02 ENCOUNTER — Encounter: Payer: Self-pay | Admitting: Orthopedic Surgery

## 2016-03-02 VITALS — BP 162/102 | Ht 75.0 in | Wt 215.0 lb

## 2016-03-02 DIAGNOSIS — S83241D Other tear of medial meniscus, current injury, right knee, subsequent encounter: Secondary | ICD-10-CM

## 2016-03-02 DIAGNOSIS — S82142D Displaced bicondylar fracture of left tibia, subsequent encounter for closed fracture with routine healing: Secondary | ICD-10-CM

## 2016-03-02 NOTE — Progress Notes (Signed)
Patient ID: Ruben Lee, male   DOB: [birth year], 65 y.o.   MRN: [id number]  Chief Complaint  Patient presents with  . Knee Problem    right knee pain, referred by Dr Luna Glasgow    HPI 65 year old male referred to me by Dr. Luna Glasgow presents with atraumatic onset of several week history of pain right knee medial aspect described as aching 3 out of 10 worse with weightbearing fluctuating associated loss of motion with increased pain with weightbearing  Previous treatment ice and rest crutches  MRI showed torn medial meniscus stress fracture medial tibial plateau degenerative changes of the cartilage of the moderate level of medial compartment and knee effusion with Baker's cyst  He would like to proceed with surgery risks and benefits explained  ROS   Dr. Brooke Bonito note from 02/11/2016 indicates no congestion no cough no chest pain no cold or heat intolerance no environmental allergies and no numbness or tingling  Past Medical History  Diagnosis Date  . Hypertension   . Hyperlipidemia     BP 162/102 mmHg  Ht 6\' 3"  (1.905 m)  Wt 215 lb (97.523 kg)  BMI 26.87 kg/m2  Physical Exam Physical Exam  Constitutional: The patient appears well-developed and well-nourished. No distress.  The patient is oriented to person, place, and time.  Psychiatric: The patient has a normal mood and affect.  Cardiovascular: Intact distal pulses.   Neurological: sensation is normal  Skin: Skin is warm and dry. No rash noted. The patient is not diaphoretic. No erythema. No pallor.    Ortho Exam  Left knee stability and strength was normal range of motion was full and there was no swelling or effusion  Right knee small effusion medial tibial plateau tenderness posteromedial corner joint line tenderness ligaments stable muscle strength and tone normal skin intact good distal pulses no edema normal sensation  ASSESSMENT AND PLAN   My interpretation of the MRI reveals arthritis and torn medial meniscus  stress reaction medial tibia  Plain films 3 views right knee I agree with Dr. Brooke Bonito note which indicates effusion with medial degenerative changes mild  Plan for arthroscopy right knee and partial medial meniscectomy  Postoperative plan hinge knee brace for 6 weeks  This procedure has been fully reviewed with the patient and written informed consent has been obtained.   Arthroscopy right knee with partial medial meniscectomy Arther Abbott, MD 03/02/2016 3:11 PM

## 2016-03-02 NOTE — Addendum Note (Signed)
Addended by: Baldomero Lamy B on: 03/02/2016 03:20 PM   Modules accepted: Orders

## 2016-03-02 NOTE — Patient Instructions (Signed)
You have decided to proceed with operative arthroscopy of the knee. You have decided not to continue with nonoperative measures such as but not limited to oral medication, weight loss, activity modification, physical therapy, bracing, or injection.  We will perform operative arthroscopy of the knee. Some of the risks associated with arthroscopic surgery of the knee include but are not limited to Bleeding Infection Swelling Stiffness Blood clot Pain  If you're not comfortable with these risks and would like to continue with nonoperative treatment please let Dr. Sharlene Mccluskey know prior to your surgery. 

## 2016-03-04 ENCOUNTER — Telehealth: Payer: Self-pay | Admitting: *Deleted

## 2016-03-04 NOTE — Telephone Encounter (Signed)
Reference # [id number] received per [place] with [facility] for outpatient surgery CPT code [phone] scheduled for 03/11/16 Coverage effective through the end of this month 03/16/2016.

## 2016-03-05 NOTE — Patient Instructions (Signed)
Ruben Lee  03/05/2016     @PREFPERIOPPHARMACY @   Your procedure is scheduled on  03/11/2016   Report to [facility] at  940  A.M.  Call this number if you have problems the morning of surgery:  [phone]   Remember:  Do not eat food or drink liquids after midnight.  Take these medicines the morning of surgery with A SIP OF WATER  Hydrocodone,cozaar.   Do not wear jewelry, make-up or nail polish.  Do not wear lotions, powders, or perfumes.  You may wear deoderant.  Do not shave 48 hours prior to surgery.  Men may shave face and neck.  Do not bring valuables to the hospital.  [facility] is not responsible for any belongings or valuables.  Contacts, dentures or bridgework may not be worn into surgery.  Leave your suitcase in the car.  After surgery it may be brought to your room.  For patients admitted to the hospital, discharge time will be determined by your treatment team.  Patients discharged the day of surgery will not be allowed to drive home.   Name and phone number of your driver:   family Special instructions:  none  Please read over the following fact sheets that you were given. Coughing and Deep Breathing, Surgical Site Infection Prevention, Anesthesia Post-op Instructions and Care and Recovery After Surgery      Meniscus Injury, Arthroscopy Arthroscopy is a surgical procedure that involves the use of a small scope that has a camera and surgical instruments on the end (arthroscope). An arthroscope can be used to repair your meniscus injury.  LET [facility] CARE PROVIDER KNOW ABOUT:  Any allergies you have.  All medicines you are taking, including vitamins, herbs, eyedrops, creams, and over-the-counter medicines.  Any recent colds or infections you have had or currently have.  Previous problems you or members of your family have had with the use of anesthetics.  Any blood disorders or blood clotting problems you have.  Previous surgeries  you have had.  Medical conditions you have. RISKS AND COMPLICATIONS Generally, this is a safe procedure. However, as with any procedure, problems can occur. Possible problems include:  Damage to nerves or blood vessels.  Excess bleeding.  Blood clots.  Infection. BEFORE THE PROCEDURE  Do not eat or drink for 6-8 hours before the procedure.  Take medicines as directed by your surgeon. Ask your surgeon about changing or stopping your regular medicines.  You may have lab tests the morning of surgery. PROCEDURE  You will be given one of the following:   A medicine that numbs the area (local anesthesia).  A medicine that makes you go to sleep (general anesthesia).  A medicine injected into your spine that numbs your body below the waist (spinal anesthesia). Most often, several small cuts (incisions) are made in the knee. The arthroscope and instruments go into the incisions to repair the damage. The torn portion of the meniscus is removed.  During this time, your surgeon may find a partial or complete tear in a cruciate ligament, such as the anterior cruciate ligament (ACL). A completely torn cruciate ligament is reconstructed by taking tissue from another part of the body (grafting) and placing it into the injured area. This requires several larger incisions to complete the repair. Sometimes, open surgery is needed for collateral ligament injuries. If a collateral ligament is found to be injured, your surgeon may staple or suture the tear through  a slightly larger incision on the side of the knee. AFTER THE PROCEDURE You will be taken to the recovery area where your progress will be monitored. When you are awake, stable, and taking fluids without complications, you will be allowed to go home. This is usually the same day. However, more extensive repairs of a ligament may require an overnight stay.  The recovery time after repairing your meniscus or ligament depends on the amount of damage  to these structures. It also depends on whether or not reconstructive knee surgery was needed.   A torn or stretched ligament (ligament sprain) may take 6-8 weeks to heal. It takes about the same amount of time if your surgeon removed a torn meniscus.  A repaired meniscus may require 6-12 weeks of recovery time.  A torn ligament needing reconstructive surgery may take 6-12 months to heal fully.   This information is not intended to replace advice given to you by your health care provider. Make sure you discuss any questions you have with your health care provider.   Document Released: 07/31/2000 Document Revised: 08/08/2013 Document Reviewed: 12/30/2012 Elsevier Interactive Patient Education 2016 [employer]. Arthroscopy, With Meniscus Injury, Care After Refer to this sheet in the next few weeks. These instructions provide you with general information on caring for yourself after your procedure. Your health care provider may also give you specific instructions. Your treatment has been planned according to the current medical practices, but problems sometimes occur. Call your health care provider if you have any problems or questions after your procedure. WHAT TO EXPECT AFTER THE PROCEDURE After your procedure, it is typical to have the following:  Pain and swelling in your knee.  Constipation.  Difficulty walking. HOME CARE INSTRUCTIONS   Use crutches and do knee exercises as directed by your health care provider.  Apply ice to the injured area:  Put ice in a plastic bag.  Place a towel between your skin and the bag.  Leave the ice on for 15-20 minutes, 3-4 times a day while awake. Do this for the first 2 days.  Rest and raise (elevate) your knee.  Change bandages (dressings) as directed by your health care provider.  Keep the wound dry and clean. The wound may be washed gently with soap and water. Gently blot or dab the wound dry. It is okay to take showers 24-48 hours after  surgery. Do not take baths, use swimming pools, or use hot tubs for 14 days, or as directed by your health care provider.  Only take over-the-counter or prescription medicines for pain, discomfort, or fever as directed by your health care provider.  Continue your normal diet as directed by your health care provider.  Do not lift anything more than 10 pounds or play contact sports for 3 weeks, or as directed by your health care provider.  If a brace was applied, use as directed by your health care provider.  Your health care provider will help with instructions for rehabilitation of your knee. SEEK MEDICAL CARE IF:   You have increased bleeding (more than a small spot) from the wound.  You have redness, swelling, or increasing pain in the wound.  Yellowish-white fluid (pus) is coming from your wound. SEEK IMMEDIATE MEDICAL CARE IF:   You develop a rash.  You have a fever or persistent symptoms for more than 2-3 days.  You have difficulty breathing.  You have increasing pain with movement of the knee. MAKE SURE YOU:   Understand these  instructions.  Will watch your condition.  Will get help right away if you are not doing well or get worse.   This information is not intended to replace advice given to you by your health care provider. Make sure you discuss any questions you have with your health care provider.   Document Released: 02/20/2005 Document Revised: 04/05/2013 Document Reviewed: 01/10/2013 Elsevier Interactive Patient Education 2016 [employer] PATIENT INSTRUCTIONS POST-ANESTHESIA  IMMEDIATELY FOLLOWING SURGERY:  Do not drive or operate machinery for the first twenty four hours after surgery.  Do not make any important decisions for twenty four hours after surgery or while taking narcotic pain medications or sedatives.  If you develop intractable nausea and vomiting or a severe headache please notify your doctor immediately.  FOLLOW-UP:  Please make an  appointment with your surgeon as instructed. You do not need to follow up with anesthesia unless specifically instructed to do so.  WOUND CARE INSTRUCTIONS (if applicable):  Keep a dry clean dressing on the anesthesia/puncture wound site if there is drainage.  Once the wound has quit draining you may leave it open to air.  Generally you should leave the bandage intact for twenty four hours unless there is drainage.  If the epidural site drains for more than 36-48 hours please call the anesthesia department.  QUESTIONS?:  Please feel free to call your physician or the hospital operator if you have any questions, and they will be happy to assist you.

## 2016-03-09 ENCOUNTER — Encounter (HOSPITAL_COMMUNITY)
Admission: RE | Admit: 2016-03-09 | Discharge: 2016-03-09 | Disposition: A | Discharge status: Home / Self Care | Payer: Commercial Managed Care - HMO | Source: Ambulatory Visit | Attending: Orthopedic Surgery | Admitting: Orthopedic Surgery

## 2016-03-09 ENCOUNTER — Encounter (HOSPITAL_COMMUNITY): Payer: Self-pay

## 2016-03-09 ENCOUNTER — Other Ambulatory Visit: Payer: Self-pay

## 2016-03-09 DIAGNOSIS — X58XXXA Exposure to other specified factors, initial encounter: Secondary | ICD-10-CM | POA: Diagnosis not present

## 2016-03-09 DIAGNOSIS — Z79899 Other long term (current) drug therapy: Secondary | ICD-10-CM | POA: Diagnosis not present

## 2016-03-09 DIAGNOSIS — E785 Hyperlipidemia, unspecified: Secondary | ICD-10-CM | POA: Diagnosis not present

## 2016-03-09 DIAGNOSIS — S83241A Other tear of medial meniscus, current injury, right knee, initial encounter: Secondary | ICD-10-CM | POA: Diagnosis present

## 2016-03-09 DIAGNOSIS — Z87891 Personal history of nicotine dependence: Secondary | ICD-10-CM | POA: Diagnosis not present

## 2016-03-09 DIAGNOSIS — I10 Essential (primary) hypertension: Secondary | ICD-10-CM | POA: Diagnosis not present

## 2016-03-09 DIAGNOSIS — Z7982 Long term (current) use of aspirin: Secondary | ICD-10-CM | POA: Diagnosis not present

## 2016-03-09 DIAGNOSIS — S83231A Complex tear of medial meniscus, current injury, right knee, initial encounter: Secondary | ICD-10-CM | POA: Diagnosis not present

## 2016-03-09 LAB — BASIC METABOLIC PANEL
Anion gap: 4 — ABNORMAL LOW (ref 5–15)
BUN: 19 mg/dL (ref 6–20)
CALCIUM: 9.2 mg/dL (ref 8.9–10.3)
CO2: 27 mmol/L (ref 22–32)
CREATININE: 0.88 mg/dL (ref 0.61–1.24)
Chloride: 106 mmol/L (ref 101–111)
GFR calc non Af Amer: >60 mL/min (ref >60)
GLUCOSE: 133 mg/dL — AB (ref 65–99)
Potassium: 4.7 mmol/L (ref 3.5–5.1)
Sodium: 137 mmol/L (ref 135–145)

## 2016-03-09 LAB — CBC WITH DIFFERENTIAL/PLATELET
BASOS PCT: 1 %
Basophils Absolute: 0 10*3/uL (ref 0.0–0.1)
Eosinophils Absolute: 0.1 10*3/uL (ref 0.0–0.7)
Eosinophils Relative: 2 %
HEMATOCRIT: 43 % (ref 39.0–52.0)
Hemoglobin: 15.1 g/dL (ref 13.0–17.0)
Lymphocytes Relative: 24 %
Lymphs Abs: 1.7 10*3/uL (ref 0.7–4.0)
MCH: 33.2 pg (ref 26.0–34.0)
MCHC: 35.1 g/dL (ref 30.0–36.0)
MCV: 94.5 fL (ref 78.0–100.0)
MONO ABS: 0.6 10*3/uL (ref 0.1–1.0)
MONOS PCT: 8 %
NEUTROS ABS: 4.7 10*3/uL (ref 1.7–7.7)
Neutrophils Relative %: 65 %
Platelets: 182 10*3/uL (ref 150–400)
RBC: 4.55 MIL/uL (ref 4.22–5.81)
RDW: 12.3 % (ref 11.5–15.5)
WBC: 7.1 10*3/uL (ref 4.0–10.5)

## 2016-03-10 NOTE — H&P (Signed)
Carole Civil, MD  Orthopedics   Patient ID: Ruben Lee, male   DOB: 11-Mar-1951, 65 y.o.   MRN: [id number]       Chief Complaint  Patient presents with  . Knee Problem      right knee pain, referred by Dr Luna Glasgow      HPI 65 year old male referred to me by Dr. Luna Glasgow presents with atraumatic onset of several week history of pain right knee medial aspect described as aching 3 out of 10 worse with weightbearing fluctuating associated loss of motion with increased pain with weightbearing   Previous treatment ice and rest crutches   MRI showed torn medial meniscus stress fracture medial tibial plateau degenerative changes of the cartilage of the moderate level of medial compartment and knee effusion with Baker's cyst   He would like to proceed with surgery risks and benefits explained   Past Medical History:  Diagnosis Date  . Hyperlipidemia   . Hypertension    Past Surgical History:  Procedure Laterality Date  . COLONOSCOPY N/A 09/16/2015   Procedure: COLONOSCOPY;  Surgeon: Danie Binder, MD;  Location: [facility];  Service: Endoscopy;  Laterality: N/A;  1230pm  . None to date     As of 08/29/15   Family History  Problem Relation Age of Onset  . Colon cancer Cousin     "maybe a distant cousin"   Social History  Substance Use Topics  . Smoking status: Former Smoker    Packs/day: 1.00    Years: 43.00    Types: Cigarettes    Quit date: 07/18/2010  . Smokeless tobacco: Never Used  . Alcohol use No     Comment: None in 20 years    ROS    Dr. Brooke Bonito note from 02/11/2016 indicates no congestion no cough no chest pain no cold or heat intolerance no environmental allergies and no numbness or tingling       Past Medical History  Diagnosis Date  . Hypertension    . Hyperlipidemia        BP 162/102 mmHg  Ht 6\' 3"  (1.905 m)  Wt 215 lb (97.523 kg)  BMI 26.87 kg/m2   Physical Exam Physical Exam  Constitutional: The patient appears well-developed and  well-nourished. No distress.  The patient is oriented to person, place, and time.  Psychiatric: The patient has a normal mood and affect.  Cardiovascular: Intact distal pulses.   Neurological: sensation is normal  Skin: Skin is warm and dry. No rash noted. The patient is not diaphoretic. No erythema. No pallor.    Ortho Exam   Upper extremities right left show no contracture of subluxation atrophy tremor skin changes, nodules pulse abnormality or sensory change  Left knee stability and strength was normal range of motion was full and there was no swelling or effusion   Right knee small effusion medial tibial plateau tenderness posteromedial corner joint line tenderness ligaments stable muscle strength and tone normal skin intact good distal pulses no edema normal sensation   ASSESSMENT AND PLAN    My interpretation of the MRI reveals arthritis and torn medial meniscus stress reaction medial tibia   Plain films 3 views right knee I agree with Dr. Brooke Bonito note which indicates effusion with medial degenerative changes mild   Plan for arthroscopy right knee and partial medial meniscectomy   Postoperative plan hinge knee brace for 6 weeks   This procedure has been fully reviewed with the patient and written informed consent has been  obtained.     Arthroscopy right knee with partial medial meniscectomy

## 2016-03-11 ENCOUNTER — Ambulatory Visit (HOSPITAL_COMMUNITY): Payer: Commercial Managed Care - HMO | Admitting: Anesthesiology

## 2016-03-11 ENCOUNTER — Encounter (HOSPITAL_COMMUNITY)
Admission: RE | Disposition: A | Discharge status: Home / Self Care | Payer: Self-pay | Source: Ambulatory Visit | Attending: Orthopedic Surgery

## 2016-03-11 ENCOUNTER — Encounter (HOSPITAL_COMMUNITY): Payer: Self-pay | Admitting: *Deleted

## 2016-03-11 ENCOUNTER — Ambulatory Visit (HOSPITAL_COMMUNITY)
Admission: RE | Admit: 2016-03-11 | Discharge: 2016-03-11 | Disposition: A | Discharge status: Home / Self Care | Payer: Commercial Managed Care - HMO | Source: Ambulatory Visit | Attending: Orthopedic Surgery | Admitting: Orthopedic Surgery

## 2016-03-11 DIAGNOSIS — M23321 Other meniscus derangements, posterior horn of medial meniscus, right knee: Secondary | ICD-10-CM

## 2016-03-11 DIAGNOSIS — E785 Hyperlipidemia, unspecified: Secondary | ICD-10-CM | POA: Insufficient documentation

## 2016-03-11 DIAGNOSIS — Z87891 Personal history of nicotine dependence: Secondary | ICD-10-CM | POA: Insufficient documentation

## 2016-03-11 DIAGNOSIS — I10 Essential (primary) hypertension: Secondary | ICD-10-CM | POA: Insufficient documentation

## 2016-03-11 DIAGNOSIS — Z7982 Long term (current) use of aspirin: Secondary | ICD-10-CM | POA: Insufficient documentation

## 2016-03-11 DIAGNOSIS — S83231A Complex tear of medial meniscus, current injury, right knee, initial encounter: Secondary | ICD-10-CM | POA: Diagnosis not present

## 2016-03-11 DIAGNOSIS — M23329 Other meniscus derangements, posterior horn of medial meniscus, unspecified knee: Secondary | ICD-10-CM

## 2016-03-11 DIAGNOSIS — X58XXXA Exposure to other specified factors, initial encounter: Secondary | ICD-10-CM | POA: Insufficient documentation

## 2016-03-11 DIAGNOSIS — Z79899 Other long term (current) drug therapy: Secondary | ICD-10-CM | POA: Insufficient documentation

## 2016-03-11 HISTORY — PX: KNEE ARTHROSCOPY WITH MEDIAL MENISECTOMY: SHX5651

## 2016-03-11 SURGERY — ARTHROSCOPY, KNEE, WITH MEDIAL MENISCECTOMY
Anesthesia: General | Site: Knee | Laterality: Right

## 2016-03-11 MED ORDER — BUPIVACAINE-EPINEPHRINE (PF) 0.5% -1:200000 IJ SOLN
INTRAMUSCULAR | Status: AC
  Filled 2016-03-11: qty 60

## 2016-03-11 MED ORDER — CEFAZOLIN SODIUM-DEXTROSE 2-4 GM/100ML-% IV SOLN
INTRAVENOUS | Status: AC
  Filled 2016-03-11: qty 100

## 2016-03-11 MED ORDER — MIDAZOLAM HCL 2 MG/2ML IJ SOLN
INTRAMUSCULAR | Status: AC
  Filled 2016-03-11: qty 2

## 2016-03-11 MED ORDER — FENTANYL CITRATE (PF) 100 MCG/2ML IJ SOLN
INTRAMUSCULAR | Status: DC | PRN
  Administered 2016-03-11: 50 ug via INTRAVENOUS

## 2016-03-11 MED ORDER — MIDAZOLAM HCL 5 MG/5ML IJ SOLN
INTRAMUSCULAR | Status: DC | PRN
  Administered 2016-03-11: 2 mg via INTRAVENOUS

## 2016-03-11 MED ORDER — MIDAZOLAM HCL 2 MG/2ML IJ SOLN
1.0000 mg | INTRAMUSCULAR | Status: DC | PRN
  Administered 2016-03-11: 2 mg via INTRAVENOUS
  Filled 2016-03-11: qty 2

## 2016-03-11 MED ORDER — FENTANYL CITRATE (PF) 250 MCG/5ML IJ SOLN
INTRAMUSCULAR | Status: AC
  Filled 2016-03-11: qty 5

## 2016-03-11 MED ORDER — LIDOCAINE HCL 1 % IJ SOLN
INTRAMUSCULAR | Status: DC | PRN
  Administered 2016-03-11: 25 mg via INTRADERMAL

## 2016-03-11 MED ORDER — ONDANSETRON HCL 4 MG/2ML IJ SOLN
4.0000 mg | Freq: Once | INTRAMUSCULAR | Status: DC | PRN
  Filled 2016-03-11: qty 2

## 2016-03-11 MED ORDER — KETOROLAC TROMETHAMINE 30 MG/ML IJ SOLN
INTRAMUSCULAR | Status: AC
  Filled 2016-03-11: qty 1

## 2016-03-11 MED ORDER — CEFAZOLIN SODIUM-DEXTROSE 2-4 GM/100ML-% IV SOLN
2.0000 g | INTRAVENOUS | Status: AC
  Administered 2016-03-11: 2 g via INTRAVENOUS

## 2016-03-11 MED ORDER — LIDOCAINE HCL (PF) 1 % IJ SOLN
INTRAMUSCULAR | Status: AC
  Filled 2016-03-11: qty 5

## 2016-03-11 MED ORDER — EPINEPHRINE HCL 1 MG/ML IJ SOLN
INTRAMUSCULAR | Status: AC
  Filled 2016-03-11: qty 5

## 2016-03-11 MED ORDER — EPHEDRINE SULFATE 50 MG/ML IJ SOLN
INTRAMUSCULAR | Status: DC | PRN
Start: 2016-03-11 — End: 2016-03-11
  Administered 2016-03-11: 10 mg via INTRAVENOUS

## 2016-03-11 MED ORDER — KETOROLAC TROMETHAMINE 30 MG/ML IJ SOLN
30.0000 mg | Freq: Once | INTRAMUSCULAR | Status: AC
  Administered 2016-03-11: 30 mg via INTRAVENOUS

## 2016-03-11 MED ORDER — PROPOFOL 10 MG/ML IV BOLUS
INTRAVENOUS | Status: AC
Start: 2016-03-11 — End: 2016-03-11
  Filled 2016-03-11: qty 20

## 2016-03-11 MED ORDER — HYDROCODONE-ACETAMINOPHEN 5-325 MG PO TABS
1.0000 | ORAL_TABLET | Freq: Once | ORAL | Status: AC
  Administered 2016-03-11: 1 via ORAL

## 2016-03-11 MED ORDER — SODIUM CHLORIDE 0.9 % IR SOLN
Status: DC | PRN
  Administered 2016-03-11 (×3): 3000 mL

## 2016-03-11 MED ORDER — DEXAMETHASONE SODIUM PHOSPHATE 4 MG/ML IJ SOLN
4.0000 mg | INTRAMUSCULAR | Status: AC
  Administered 2016-03-11: 4 mg via INTRAVENOUS
  Filled 2016-03-11: qty 1

## 2016-03-11 MED ORDER — FENTANYL CITRATE (PF) 100 MCG/2ML IJ SOLN
25.0000 ug | INTRAMUSCULAR | Status: DC | PRN
Start: 2016-03-11 — End: 2016-03-11

## 2016-03-11 MED ORDER — ONDANSETRON HCL 4 MG/2ML IJ SOLN
4.0000 mg | Freq: Once | INTRAMUSCULAR | Status: AC
  Administered 2016-03-11: 4 mg via INTRAVENOUS
  Filled 2016-03-11: qty 2

## 2016-03-11 MED ORDER — HYDROCODONE-ACETAMINOPHEN 7.5-325 MG PO TABS
1.0000 | ORAL_TABLET | ORAL | 0 refills | Status: DC | PRN
Start: 2016-03-11 — End: 2016-03-16

## 2016-03-11 MED ORDER — CHLORHEXIDINE GLUCONATE 4 % EX LIQD: 60.0000 mL | Freq: Once | CUTANEOUS | Status: DC

## 2016-03-11 MED ORDER — PROPOFOL 10 MG/ML IV BOLUS
INTRAVENOUS | Status: DC | PRN
  Administered 2016-03-11: 150 mg via INTRAVENOUS

## 2016-03-11 MED ORDER — LACTATED RINGERS IV SOLN
INTRAVENOUS | Status: DC
  Administered 2016-03-11: 08:00:00 via INTRAVENOUS

## 2016-03-11 MED ORDER — ONDANSETRON HCL 4 MG/2ML IJ SOLN
4.0000 mg | Freq: Once | INTRAMUSCULAR | Status: AC
  Administered 2016-03-11: 4 mg via INTRAVENOUS

## 2016-03-11 MED ORDER — HYDROCODONE-ACETAMINOPHEN 5-325 MG PO TABS
ORAL_TABLET | ORAL | Status: AC
  Filled 2016-03-11: qty 1

## 2016-03-11 MED ORDER — BUPIVACAINE-EPINEPHRINE (PF) 0.5% -1:200000 IJ SOLN
INTRAMUSCULAR | Status: DC | PRN
  Administered 2016-03-11: 60 mL via PERINEURAL

## 2016-03-11 SURGICAL SUPPLY — 52 items
BAG HAMPER (MISCELLANEOUS) ×3 IMPLANT
BANDAGE ELASTIC 6 LF NS (GAUZE/BANDAGES/DRESSINGS) ×2 IMPLANT
BANDAGE ELASTIC 6 VELCRO NS (GAUZE/BANDAGES/DRESSINGS) ×3 IMPLANT
BLADE AGGRESSIVE PLUS 4.0 (BLADE) ×3 IMPLANT
BLADE SURG SZ11 CARB STEEL (BLADE) ×3 IMPLANT
BNDG CMPR MED 5X6 ELC HKLP NS (GAUZE/BANDAGES/DRESSINGS) ×1
CHLORAPREP W/TINT 26ML (MISCELLANEOUS) ×4 IMPLANT
CLOTH BEACON ORANGE TIMEOUT ST (SAFETY) ×3 IMPLANT
COOLER CRYO IC GRAV AND TUBE (ORTHOPEDIC SUPPLIES) ×3 IMPLANT
CUFF CRYO KNEE18X23 MED (MISCELLANEOUS) ×2 IMPLANT
CUFF TOURNIQUET SINGLE 34IN LL (TOURNIQUET CUFF) ×2 IMPLANT
CUTTER ANGLED DBL BITE 4.5 (BURR) IMPLANT
DECANTER SPIKE VIAL GLASS SM (MISCELLANEOUS) ×6 IMPLANT
DRAPE HALF SHEET 40X57 (DRAPES) ×2 IMPLANT
GAUZE SPONGE 4X4 12PLY STRL (GAUZE/BANDAGES/DRESSINGS) ×3 IMPLANT
GAUZE SPONGE 4X4 16PLY XRAY LF (GAUZE/BANDAGES/DRESSINGS) ×3 IMPLANT
GAUZE XEROFORM 5X9 LF (GAUZE/BANDAGES/DRESSINGS) ×3 IMPLANT
GLOVE BIO SURGEON STRL SZ7 (GLOVE) ×2 IMPLANT
GLOVE BIOGEL PI IND STRL 7.0 (GLOVE) ×1 IMPLANT
GLOVE BIOGEL PI INDICATOR 7.0 (GLOVE) ×4
GLOVE EXAM NITRILE MD LF STRL (GLOVE) ×2 IMPLANT
GLOVE SKINSENSE NS SZ8.0 LF (GLOVE) ×2
GLOVE SKINSENSE STRL SZ8.0 LF (GLOVE) ×1 IMPLANT
GLOVE SS N UNI LF 8.5 STRL (GLOVE) ×3 IMPLANT
GOWN STRL REUS W/ TWL LRG LVL3 (GOWN DISPOSABLE) ×1 IMPLANT
GOWN STRL REUS W/TWL LRG LVL3 (GOWN DISPOSABLE) ×6
GOWN STRL REUS W/TWL XL LVL3 (GOWN DISPOSABLE) ×3 IMPLANT
IV NS IRRIG 3000ML ARTHROMATIC (IV SOLUTION) ×8 IMPLANT
KIT BLADEGUARD II DBL (SET/KITS/TRAYS/PACK) ×3 IMPLANT
KIT ROOM TURNOVER AP CYSTO (KITS) ×3 IMPLANT
MANIFOLD NEPTUNE II (INSTRUMENTS) ×3 IMPLANT
MARKER SKIN DUAL TIP RULER LAB (MISCELLANEOUS) ×3 IMPLANT
NDL HYPO 18GX1.5 BLUNT FILL (NEEDLE) ×1 IMPLANT
NDL HYPO 21X1.5 SAFETY (NEEDLE) ×1 IMPLANT
NDL SPNL 18GX3.5 QUINCKE PK (NEEDLE) ×1 IMPLANT
NEEDLE HYPO 18GX1.5 BLUNT FILL (NEEDLE) ×3 IMPLANT
NEEDLE HYPO 21X1.5 SAFETY (NEEDLE) ×3 IMPLANT
NEEDLE SPNL 18GX3.5 QUINCKE PK (NEEDLE) ×3 IMPLANT
PACK ARTHRO LIMB DRAPE STRL (MISCELLANEOUS) ×3 IMPLANT
PAD ABD 5X9 TENDERSORB (GAUZE/BANDAGES/DRESSINGS) ×3 IMPLANT
PAD ARMBOARD 7.5X6 YLW CONV (MISCELLANEOUS) ×3 IMPLANT
PADDING CAST COTTON 6X4 STRL (CAST SUPPLIES) ×3 IMPLANT
PADDING WEBRIL 6 STERILE (GAUZE/BANDAGES/DRESSINGS) ×2 IMPLANT
SET ARTHROSCOPY INST (INSTRUMENTS) ×3 IMPLANT
SET ARTHROSCOPY PUMP TUBE (IRRIGATION / IRRIGATOR) ×3 IMPLANT
SET BASIN LINEN APH (SET/KITS/TRAYS/PACK) ×3 IMPLANT
SUT ETHILON 3 0 FSL (SUTURE) ×2 IMPLANT
SYR 30ML LL (SYRINGE) ×3 IMPLANT
SYRINGE 10CC LL (SYRINGE) ×3 IMPLANT
TUBE CONNECTING 12'X1/4 (SUCTIONS) ×4
TUBE CONNECTING 12X1/4 (SUCTIONS) ×7 IMPLANT
WAND 50 DEG COVAC W/CORD (SURGICAL WAND) ×2 IMPLANT

## 2016-03-11 NOTE — Addendum Note (Signed)
Addendum  created 03/11/16 1004 by Charmaine Downs, CRNA   Anesthesia Intra Flowsheets edited

## 2016-03-11 NOTE — Anesthesia Postprocedure Evaluation (Signed)
Anesthesia Post Note  Patient: Ruben Lee  Procedure(s) Performed: Procedure(s) (LRB): KNEE ARTHROSCOPY WITH MEDIAL MENISECTOMY (Right)  Patient location during evaluation: PACU Anesthesia Type: General Level of consciousness: awake, awake and alert and oriented Pain management: pain level controlled Vital Signs Assessment: post-procedure vital signs reviewed and stable Respiratory status: spontaneous breathing, nonlabored ventilation and respiratory function stable Cardiovascular status: blood pressure returned to baseline Postop Assessment: no signs of nausea or vomiting Anesthetic complications: no    Last Vitals:  Vitals:   03/11/16 0830 03/11/16 0940  BP:    Pulse:    Resp: 14   Temp:  (P) 36.5 C    Last Pain:  Vitals:   03/11/16 0707  TempSrc: Oral  PainSc: 3                  Rashay Barnette J

## 2016-03-11 NOTE — Brief Op Note (Signed)
03/11/2016  9:29 AM  PATIENT:  Ruben Lee  65 y.o. male  PRE-OPERATIVE DIAGNOSIS:  RIGHT MEDIAL MENISCUS tear  POST-OPERATIVE DIAGNOSIS:  RIGHT MEDIAL MENISCUS tear  PROCEDURE:  Procedure(s): KNEE ARTHROSCOPY WITH MEDIAL MENISECTOMY (Right)   Findings torn posterior horn medial meniscus complex tear with horizontal cleavage components radial components.  Resected approximately 50% of the posterior horn  Mild chondral degeneration of the medial femoral condyle.  Remaining compartments relatively normal.  Details of procedure after proper site confirmation chart review and patient identification and marking of the surgical site the patient was taken to the operating room for general anesthesia. After successful general anesthesia with the patient in the supine position the right leg was prepped and draped sterilely  Timeout was completed  A lateral portal was established the scope was placed into the joint and a diagnostic arthroscopy was performed with circumferential viewing of the entire joint. Pathology is described above. A medial portal was established the meniscus was probed to define it and to define the elements of the tear.  A combination of arthroscopic biters shaver and ArthroCare wand were used to resect the meniscal tear balance it until a stable rim was confirmed by probe.  The knee was irrigated thoroughly meniscal debris was removed.  60 mL of Marcaine was injected in the portals were closed with 2 3-0 nylon sutures. Sterile dressing was applied Cryo/Cuff was applied and activated. The patient was taken to the recovery room after extubation in stable condition  SURGEON:  Surgeon(s) and Role:    * Carole Civil, MD - Primary  PHYSICIAN ASSISTANT:   ASSISTANTS: none   ANESTHESIA:   general  EBL:  Total I/O In: 600 [I.V.:600] Out: 5 [Blood:5]  BLOOD ADMINISTERED:none  DRAINS: none   LOCAL MEDICATIONS USED:  MARCAINE     SPECIMEN:  No  Specimen  DISPOSITION OF SPECIMEN:  N/A  COUNTS:  YES  TOURNIQUET:    DICTATION: .Dragon Dictation  PLAN OF CARE: Discharge to home after PACU  PATIENT DISPOSITION:  PACU - hemodynamically stable.   Delay start of Pharmacological VTE agent (>24hrs) due to surgical blood loss or risk of bleeding: not applicable

## 2016-03-11 NOTE — Anesthesia Preprocedure Evaluation (Signed)
Anesthesia Evaluation  Patient identified by MRN, date of birth, ID band Patient awake    Reviewed: Allergy & Precautions, NPO status , Patient's Chart, lab work & pertinent test results  Airway Mallampati: II  TM Distance: >3 FB     Dental  (+) Teeth Intact, Caps   Pulmonary former smoker,    breath sounds clear to auscultation       Cardiovascular hypertension,  Rhythm:Regular Rate:Normal     Neuro/Psych    GI/Hepatic negative GI ROS,   Endo/Other    Renal/GU      Musculoskeletal   Abdominal   Peds  Hematology   Anesthesia Other Findings   Reproductive/Obstetrics                             Anesthesia Physical Anesthesia Plan  ASA: II  Anesthesia Plan: General   Post-op Pain Management:    Induction: Intravenous  Airway Management Planned: LMA  Additional Equipment:   Intra-op Plan:   Post-operative Plan:   Informed Consent: I have reviewed the patients History and Physical, chart, labs and discussed the procedure including the risks, benefits and alternatives for the proposed anesthesia with the patient or authorized representative who has indicated his/her understanding and acceptance.     Plan Discussed with:   Anesthesia Plan Comments:         Anesthesia Quick Evaluation

## 2016-03-11 NOTE — Op Note (Signed)
03/11/2016  9:29 AM  PATIENT:  Ruben Lee  65 y.o. male  PRE-OPERATIVE DIAGNOSIS:  RIGHT MEDIAL MENISCUS tear  POST-OPERATIVE DIAGNOSIS:  RIGHT MEDIAL MENISCUS tear  PROCEDURE:  Procedure(s): KNEE ARTHROSCOPY WITH MEDIAL MENISECTOMY (Right)   Findings torn posterior horn medial meniscus complex tear with horizontal cleavage components radial components.  Resected approximately 50% of the posterior horn  Mild chondral degeneration of the medial femoral condyle.  Remaining compartments relatively normal.  Details of procedure after proper site confirmation chart review and patient identification and marking of the surgical site the patient was taken to the operating room for general anesthesia. After successful general anesthesia with the patient in the supine position the right leg was prepped and draped sterilely  Timeout was completed  A lateral portal was established the scope was placed into the joint and a diagnostic arthroscopy was performed with circumferential viewing of the entire joint. Pathology is described above. A medial portal was established the meniscus was probed to define it and to define the elements of the tear.  A combination of arthroscopic biters shaver and ArthroCare wand were used to resect the meniscal tear balance it until a stable rim was confirmed by probe.  The knee was irrigated thoroughly meniscal debris was removed.  60 mL of Marcaine was injected in the portals were closed with 2 3-0 nylon sutures. Sterile dressing was applied Cryo/Cuff was applied and activated. The patient was taken to the recovery room after extubation in stable condition  SURGEON:  Surgeon(s) and Role:    * Carole Civil, MD - Primary  PHYSICIAN ASSISTANT:   ASSISTANTS: none   ANESTHESIA:   general  EBL:  Total I/O In: 600 [I.V.:600] Out: 5 [Blood:5]  BLOOD ADMINISTERED:none  DRAINS: none   LOCAL MEDICATIONS USED:  MARCAINE     SPECIMEN:  No  Specimen  DISPOSITION OF SPECIMEN:  N/A  COUNTS:  YES  TOURNIQUET:    DICTATION: .Dragon Dictation  PLAN OF CARE: Discharge to home after PACU  PATIENT DISPOSITION:  PACU - hemodynamically stable.   Delay start of Pharmacological VTE agent (>24hrs) due to surgical blood loss or risk of bleeding: not applicable

## 2016-03-11 NOTE — Transfer of Care (Signed)
Immediate Anesthesia Transfer of Care Note  Patient: Ruben Lee  Procedure(s) Performed: Procedure(s): KNEE ARTHROSCOPY WITH MEDIAL MENISECTOMY (Right)  Patient Location: PACU  Anesthesia Type:General  Level of Consciousness: awake and patient cooperative  Airway & Oxygen Therapy: Patient Spontanous Breathing and Patient connected to face mask oxygen  Post-op Assessment: Report given to RN, Post -op Vital signs reviewed and stable and Patient moving all extremities  Post vital signs: Reviewed and stable  Last Vitals:  Vitals:   03/11/16 0825 03/11/16 0830  BP:    Pulse:    Resp: (!) 21 14  Temp:      Last Pain:  Vitals:   03/11/16 0707  TempSrc: Oral  PainSc: 3       Patients Stated Pain Goal: 5 ([id number])  Complications: No apparent anesthesia complications

## 2016-03-11 NOTE — Discharge Instructions (Signed)
Cryotherapy Cryo

## 2016-03-11 NOTE — Interval H&P Note (Signed)
History and Physical Interval Note:  03/11/2016 8:10 AM  Ruben Lee  has presented today for surgery, with the diagnosis of RIGHT MEDIAL MENISCUS  The various methods of treatment have been discussed with the patient and family. After consideration of risks, benefits and other options for treatment, the patient has consented to  Procedure(s) with comments: KNEE ARTHROSCOPY WITH MEDIAL MENISECTOMY (Right) - no crutch training as a surgical intervention .  The patient's history has been reviewed, patient examined, no change in status, stable for surgery.  I have reviewed the patient's chart and labs.  Questions were answered to the patient's satisfaction.     Arther Abbott

## 2016-03-11 NOTE — Anesthesia Procedure Notes (Signed)
Procedure Name: LMA Insertion Date/Time: 03/11/2016 8:41 AM Performed by: Charmaine Downs Pre-anesthesia Checklist: Patient identified, Timeout performed, Emergency Drugs available, Suction available and Patient being monitored Patient Re-evaluated:Patient Re-evaluated prior to inductionOxygen Delivery Method: Circle system utilized Preoxygenation: Pre-oxygenation with 100% oxygen Intubation Type: IV induction Ventilation: Mask ventilation without difficulty LMA: LMA inserted LMA Size: 4.0 Grade View: Grade II Number of attempts: 1 Placement Confirmation: positive ETCO2 and breath sounds checked- equal and bilateral Tube secured with: Tape Dental Injury: Teeth and Oropharynx as per pre-operative assessment

## 2016-03-13 ENCOUNTER — Emergency Department (HOSPITAL_COMMUNITY): Payer: Commercial Managed Care - HMO

## 2016-03-13 ENCOUNTER — Encounter (HOSPITAL_COMMUNITY): Payer: Self-pay | Admitting: *Deleted

## 2016-03-13 ENCOUNTER — Emergency Department (HOSPITAL_COMMUNITY)
Admission: EM | Admit: 2016-03-13 | Discharge: 2016-03-13 | Disposition: A | Discharge status: Home / Self Care | Payer: Commercial Managed Care - HMO | Attending: Emergency Medicine | Admitting: Emergency Medicine

## 2016-03-13 DIAGNOSIS — Z87891 Personal history of nicotine dependence: Secondary | ICD-10-CM | POA: Insufficient documentation

## 2016-03-13 DIAGNOSIS — Z7982 Long term (current) use of aspirin: Secondary | ICD-10-CM | POA: Insufficient documentation

## 2016-03-13 DIAGNOSIS — G8918 Other acute postprocedural pain: Secondary | ICD-10-CM | POA: Diagnosis not present

## 2016-03-13 DIAGNOSIS — M25561 Pain in right knee: Secondary | ICD-10-CM | POA: Diagnosis present

## 2016-03-13 DIAGNOSIS — E785 Hyperlipidemia, unspecified: Secondary | ICD-10-CM | POA: Insufficient documentation

## 2016-03-13 DIAGNOSIS — M25461 Effusion, right knee: Secondary | ICD-10-CM | POA: Diagnosis not present

## 2016-03-13 DIAGNOSIS — I10 Essential (primary) hypertension: Secondary | ICD-10-CM | POA: Diagnosis not present

## 2016-03-13 DIAGNOSIS — Z79899 Other long term (current) drug therapy: Secondary | ICD-10-CM | POA: Insufficient documentation

## 2016-03-13 DIAGNOSIS — Z791 Long term (current) use of non-steroidal anti-inflammatories (NSAID): Secondary | ICD-10-CM | POA: Insufficient documentation

## 2016-03-13 NOTE — ED Triage Notes (Signed)
Pt had right knee surgery on Wednesday. Pt states he began having right knee swelling and pain today. Denies any redness. He called [facility] but they are not there today.

## 2016-03-13 NOTE — Discharge Instructions (Signed)
Follow up with Dr. Aline Brochure as scheduled. Keep the knee wrapped, apply ice, elevate. Return to the ED if you develop worsening pain, swelling, fever or any other concerns,.

## 2016-03-13 NOTE — ED Provider Notes (Signed)
[facility] DEPT Provider Note   CSN: [id number] Arrival date & time: 03/13/16  1053  First Provider Contact:   12:00 AM     By signing my name below, I, Rayna Sexton, attest that this documentation has been prepared under the direction and in the presence of Ezequiel Essex, MD. Electronically Signed: Rayna Sexton, ED Scribe. 03/13/16. 12:05 PM.   History   Chief Complaint Chief Complaint  Patient presents with  . Knee Pain    HPI HPI Comments: Ruben Lee is a 65 y.o. male who presents to the Emergency Department complaining of worsening, mild, atraumatic, right knee swelling onset this morning. Pt had a right knee arthroscopy with a medial menisectomy performed 2 days ago. Beginning this morning he began experiencing his right knee swelling with associated, mild, pain when moving to a standing position or initially bearing weight. He is not on anticoagulants. He denies any falls, injuries, fevers, chills or any other associated symptoms at this time.   Orthopaedic Surgeon: Dr. Arther Abbott   The history is provided by the patient. No language interpreter was used.    Past Medical History:  Diagnosis Date  . Hyperlipidemia   . Hypertension     Patient Active Problem List   Diagnosis Date Noted  . Medial meniscus, posterior horn derangement   . Rectal bleeding 08/29/2015  . Encounter for screening colonoscopy 08/29/2015    Past Surgical History:  Procedure Laterality Date  . COLONOSCOPY N/A 09/16/2015   Procedure: COLONOSCOPY;  Surgeon: Danie Binder, MD;  Location: [facility];  Service: Endoscopy;  Laterality: N/A;  1230pm  . None to date     As of 08/29/15     Home Medications    Prior to Admission medications   Medication Sig Start Date End Date Taking? Authorizing Provider  aspirin 81 MG tablet Take 81 mg by mouth daily.   Yes Historical Provider, MD  cholecalciferol (VITAMIN D) 1000 units tablet Take 10,000 Units by mouth daily.   Yes  Historical Provider, MD  CHROMIUM-CINNAMON PO Take 2,000 mg by mouth daily.    Yes Historical Provider, MD  Coenzyme Q10 (CO Q 10) 100 MG CAPS Take 100 capsules by mouth daily.   Yes Historical Provider, MD  HYDROcodone-acetaminophen (NORCO) 7.5-325 MG tablet Take 1 tablet by mouth every 4 (four) hours as needed for moderate pain (Must last 30 days.  Do not drive or operate machinery while taking this medicine.). 03/11/16  Yes Carole Civil, MD  ibuprofen (ADVIL,MOTRIN) 200 MG tablet Take 200 mg by mouth every 6 (six) hours as needed for moderate pain.    Yes Historical Provider, MD  losartan (COZAAR) 100 MG tablet Take 100 mg by mouth daily.  08/14/15  Yes Historical Provider, MD  lovastatin (MEVACOR) 10 MG tablet Take 10 mg by mouth at bedtime.  08/14/15  Yes Historical Provider, MD  Multiple Vitamin (MULTIVITAMIN) capsule Take 1 capsule by mouth daily.   Yes Historical Provider, MD  UNABLE TO FIND Take 1 tablet by mouth every evening. [place] daily    Yes Historical Provider, MD    Family History Family History  Problem Relation Age of Onset  . Colon cancer Cousin     "maybe a distant cousin"    Social History Social History  Substance Use Topics  . Smoking status: Former Smoker    Packs/day: 1.00    Years: 43.00    Types: Cigarettes    Quit date: 07/18/2010  .  Smokeless tobacco: Never Used  . Alcohol use No     Comment: None in 20 years     Allergies   Review of patient's allergies indicates no known allergies.   Review of Systems Review of Systems  Constitutional: Negative for chills and fever.  Musculoskeletal: Positive for arthralgias and joint swelling.  Skin: Positive for wound.  All other systems reviewed and are negative.   Physical Exam Updated Vital Signs BP (!) 159/110   Pulse (!) 58   Temp 98.3 F (36.8 C) (Oral)   Resp 16   Ht 6\' 3"  (1.905 m)   Wt 215 lb (97.5 kg)   SpO2 98%   BMI 26.87 kg/m   Physical Exam  Constitutional:  He is oriented to person, place, and time. He appears well-developed and well-nourished. No distress.  HENT:  Head: Normocephalic and atraumatic.  Mouth/Throat: Oropharynx is clear and moist. No oropharyngeal exudate.  Eyes: Conjunctivae and EOM are normal. Pupils are equal, round, and reactive to light.  Neck: Normal range of motion. Neck supple.  No meningismus.  Cardiovascular: Normal rate, regular rhythm, normal heart sounds and intact distal pulses.   No murmur heard. Pulmonary/Chest: Effort normal and breath sounds normal. No respiratory distress.  Abdominal: Soft. There is no tenderness. There is no rebound and no guarding.  Musculoskeletal: Normal range of motion. He exhibits no edema or tenderness.  Right knee diffusely swollen with suprapatellar effusion. Flexion and extension intact. No calf tenderness. Intact DP/PT pulses. Surgical incision is healing well.   Neurological: He is alert and oriented to person, place, and time. No cranial nerve deficit. He exhibits normal muscle tone. Coordination normal.   5/5 strength throughout. CN 2-12 intact.Equal grip strength.   Skin: Skin is warm.  Psychiatric: He has a normal mood and affect. His behavior is normal.  Nursing note and vitals reviewed.  ED Treatments / Results  Labs (all labs ordered are listed, but only abnormal results are displayed) Labs Reviewed - No data to display  EKG  EKG Interpretation None       Radiology US Venous Img Lower Unilateral Right  Result Date: 03/13/2016 CLINICAL DATA:  Right knee pain and swelling. Patient is 2 days post right knee arthroscopic. EXAM: RIGHT LOWER EXTREMITY VENOUS DOPPLER ULTRASOUND TECHNIQUE: Gray-scale sonography with graded compression, as well as color Doppler and duplex ultrasound were performed to evaluate the lower extremity deep venous systems from the level of the common femoral vein and including the common femoral, femoral, profunda femoral, popliteal and calf veins  including the posterior tibial, peroneal and gastrocnemius veins when visible. The superficial great saphenous vein was also interrogated. Spectral Doppler was utilized to evaluate flow at rest and with distal augmentation maneuvers in the common femoral, femoral and popliteal veins. COMPARISON:  MRI of the knee 02/06/2016 FINDINGS: Contralateral Common Femoral Vein: Respiratory phasicity is normal and symmetric with the symptomatic side. No evidence of thrombus. Normal compressibility. Common Femoral Vein: No evidence of thrombus. Normal compressibility, respiratory phasicity and response to augmentation. Saphenofemoral Junction: No evidence of thrombus. Normal compressibility and flow on color Doppler imaging. Profunda Femoral Vein: No evidence of thrombus. Normal compressibility and flow on color Doppler imaging. Femoral Vein: No evidence of thrombus. Normal compressibility, respiratory phasicity and response to augmentation. Popliteal Vein: No evidence of thrombus. Normal compressibility, respiratory phasicity and response to augmentation. Calf Veins: No evidence of thrombus. Normal compressibility and flow on color Doppler imaging. Superficial Great Saphenous Vein: No evidence of thrombus. Normal compressibility  and flow on color Doppler imaging. Venous Reflux:  None. Other Findings: There is a complex fluid collection superior to the patella, which measures 5.5 by 6.1 cm. IMPRESSION: No evidence of deep venous thrombosis. 6 x 1 cm complex fluid collection superior to the patella which may represent suprapatellar joint effusion versus intramuscular hematoma/seroma. Electronically Signed   By: Fidela Salisbury M.D.   On: 03/13/2016 13:03  Dg Knee Complete 4 Views Right  Result Date: 03/13/2016 CLINICAL DATA:  Acute right knee pain. Status post meniscus repair 2 days ago. EXAM: RIGHT KNEE - COMPLETE 4+ VIEW COMPARISON:  MRI of February 06, 2016.  Radiographs of January 29, 2016. FINDINGS: No evidence of fracture,  dislocation, or joint effusion. No evidence of arthropathy is noted. Gas is seen within the suprapatellar bursa consistent with postoperative status. IMPRESSION: Probable postoperative gas seen in suprapatellar bursa. No other abnormality seen in the right knee. Electronically Signed   By: Marijo Conception, M.D.   On: 03/13/2016 12:45   Procedures Procedures  DIAGNOSTIC STUDIES: Oxygen Saturation is 96% on RA, normal by my interpretation.    COORDINATION OF CARE: 12:03 PM Discussed next steps with pt. Pt verbalized understanding and is agreeable with the plan.   1:15 PM Spoke with Dr. Aline Brochure who confirmed pt's swelling is nml. He recommended the RICE method and f/u in his office in three days. Will discuss plan with pt.   1:26 PM Discussed conversation with Dr. Kenton Kingfisher and next steps for treatment of his knee until his reevaluation in 3 days. Pt denies n/v or any other complaints at this time. Pt verbalized understanding of the plan and is amicable.    Medications Ordered in ED Medications - No data to display   Initial Impression / Assessment and Plan / ED Course  I have reviewed the triage vital signs and the nursing notes.  Pertinent labs & imaging results that were available during my care of the patient were reviewed by me and considered in my medical decision making (see chart for details).  Clinical Course   Knee pain after taking off dressing After knee arthroscopy by Dr. Aline Brochure 2 days ago. Denies fever denies vomiting. Denies any trauma.  No DVT on [place], probable seroma or hematoma.  D/w Dr. Aline Brochure. He states this is normal amout of swelling after knee surgery.  Recommends ice, elevation, he will see patient in office on Monday as scheduled    I personally performed the services described in this documentation, which was scribed in my presence. The recorded information has been reviewed and is accurate.   Final Clinical Impressions(s) / ED Diagnoses   Final  diagnoses:  Post-operative pain  Knee effusion, right    New Prescriptions Discharge Medication List as of 03/13/2016  1:46 PM       Ezequiel Essex, MD 03/13/16 1659

## 2016-03-16 ENCOUNTER — Encounter: Payer: Self-pay | Admitting: Orthopedic Surgery

## 2016-03-16 ENCOUNTER — Ambulatory Visit (INDEPENDENT_AMBULATORY_CARE_PROVIDER_SITE_OTHER): Payer: Commercial Managed Care - HMO | Admitting: Orthopedic Surgery

## 2016-03-16 VITALS — BP 164/111 | Ht 75.0 in | Wt 215.0 lb

## 2016-03-16 DIAGNOSIS — Z9889 Other specified postprocedural states: Secondary | ICD-10-CM

## 2016-03-16 DIAGNOSIS — Z4789 Encounter for other orthopedic aftercare: Secondary | ICD-10-CM

## 2016-03-16 MED ORDER — FUROSEMIDE 20 MG PO TABS: 20.0000 mg | ORAL_TABLET | Freq: Every day | ORAL | 0 refills | Status: DC

## 2016-03-16 MED ORDER — HYDROCODONE-ACETAMINOPHEN 5-325 MG PO TABS: 1.0000 | ORAL_TABLET | Freq: Four times a day (QID) | ORAL | 0 refills | Status: DC | PRN

## 2016-03-16 NOTE — Patient Instructions (Signed)
START THERAPY, CALL PHYSICAL THERAPY DEPT TO MAKE APPT   CONTINUE ICE AS NEEDED IF SWOLLEN   STOP USING CRUTCHES WHEN YOU FEEL COMFORTABLE

## 2016-03-16 NOTE — Addendum Note (Signed)
Addended by: Carole Civil on: 03/16/2016 04:53 PM   Modules accepted: Orders

## 2016-03-16 NOTE — Progress Notes (Signed)
Chief Complaint  Patient presents with  . Follow-up    POST OP 1, [facility], DOS 03/11/16   PRE-OPERATIVE DIAGNOSIS:  RIGHT MEDIAL MENISCUS tear   POST-OPERATIVE DIAGNOSIS:  RIGHT MEDIAL MENISCUS tear   PROCEDURE:  Procedure(s): KNEE ARTHROSCOPY WITH MEDIAL MENISECTOMY (Right)     Findings torn posterior horn medial meniscus complex tear with horizontal cleavage components radial components.   Resected approximately 50% of the posterior horn   Mild chondral degeneration of the medial femoral condyle.   Remaining compartments relatively normal.   Looks good today flexion to about 85 full extension portals are clean  Pain has resolved. Patient taken to the ER for swelling. Looks to be doing fine now.  Start physical therapy reduce opioid dosage  Follow-up 3 weeks

## 2016-03-17 ENCOUNTER — Encounter (HOSPITAL_COMMUNITY): Payer: Self-pay | Admitting: Orthopedic Surgery

## 2016-03-17 ENCOUNTER — Encounter (HOSPITAL_COMMUNITY): Payer: Self-pay

## 2016-03-19 ENCOUNTER — Ambulatory Visit (HOSPITAL_COMMUNITY): Payer: PPO | Attending: Orthopedic Surgery | Admitting: Physical Therapy

## 2016-03-19 ENCOUNTER — Telehealth (HOSPITAL_COMMUNITY): Payer: Self-pay

## 2016-03-19 DIAGNOSIS — R600 Localized edema: Secondary | ICD-10-CM | POA: Diagnosis not present

## 2016-03-19 DIAGNOSIS — M25561 Pain in right knee: Secondary | ICD-10-CM | POA: Diagnosis not present

## 2016-03-19 DIAGNOSIS — M6281 Muscle weakness (generalized): Secondary | ICD-10-CM | POA: Diagnosis not present

## 2016-03-19 NOTE — Therapy (Signed)
[place], [place], [address] Phone: [phone]   Fax:  [phone]  Physical Therapy Evaluation  Patient Details  Name: Ruben Lee MRN: [id number] Date of Birth: 03/29/1951 Referring Provider: Arther Abbott   Encounter Date: 03/19/2016      PT End of Session - 03/19/16 1200    Visit Number 1   Number of Visits 12   Date for PT Re-Evaluation 04/09/16   Authorization Type [employer]    Authorization Time Period 03/19/16 to 04/30/16   Authorization - Visit Number 1   Authorization - Number of Visits 10   PT Start Time 1116   PT Stop Time 1154   PT Time Calculation (min) 38 min   Activity Tolerance Patient tolerated treatment well   Behavior During Therapy [facility] for tasks assessed/performed      Past Medical History:  Diagnosis Date  . Hyperlipidemia   . Hypertension     Past Surgical History:  Procedure Laterality Date  . COLONOSCOPY N/A 09/16/2015   Procedure: COLONOSCOPY;  Surgeon: Danie Binder, MD;  [facility];  Service: Endoscopy;  Laterality: N/A;  1230pm  . KNEE ARTHROSCOPY WITH MEDIAL MENISECTOMY Right 03/11/2016   Procedure: KNEE ARTHROSCOPY WITH MEDIAL MENISECTOMY;  Surgeon: Carole Civil, MD;  Location: [facility];  Service: Orthopedics;  Laterality: Right;  . None to date     As of 08/29/15    There were no vitals filed for this visit.       Subjective Assessment - 03/19/16 1119    Subjective Patient states he is not sure what started causing his knee pain or how he tore his meniscus, he just woke up hurting one day. He had arthroscopic surgery on the 26th by Dr. Aline Brochure. He felt fantastic the day of, but a couple days later his knee really started to hurt him. The hardest thing for him to do right now is stairs, he just tried using them for the first time today. His balance has been OK, no falls or close calls recently.    Pertinent History no significant PMH or  PSH    How long can you sit comfortably? unlimited    How long can you stand comfortably? unlimited    How long can you walk comfortably? can go long distances but it will ache    Patient Stated Goals get back to PLOF    Currently in Pain? No/denies  but at worst it can get to an 8/10, usually with flexion             [facility] PT Assessment - 03/19/16 0001      Assessment   Medical Diagnosis R knee arthroscopic surgery    Referring Provider Arther Abbott    Onset Date/Surgical Date 03/11/16   Next MD Visit Dr. Aline Brochure on the [place]   Has the patient fallen in the past 6 months No   Has the patient had a decrease in activity level because of a fear of falling?  No   Is the patient reluctant to leave their home because of a fear of falling?  No     Prior Function   Level of Independence Independent;Independent with basic ADLs;Independent with gait;Independent with transfers   Vocation Retired   Leisure going to [place], typically very active      Observation/Other Assessments   Skin Integrity incisions appear well healing with no signs of  inflammation or infection; localized edema noted R knee   Focus on Therapeutic Outcomes (FOTO)  51% limited      AROM   Right Knee Extension 5   Right Knee Flexion 125     Strength   Right Hip Flexion 4/5   Right Hip Extension 4-/5   Right Hip ABduction 4+/5   Left Hip Flexion 5/5   Left Hip Extension 3/5   Left Hip ABduction 5/5   Right Knee Flexion 4/5   Right Knee Extension 4/5   Left Knee Flexion 5/5   Left Knee Extension 5/5   Right Ankle Dorsiflexion 5/5   Left Ankle Dorsiflexion 5/5     Ambulation/Gait   Gait Comments proximal muscle weakness      6 minute walk test results    Aerobic Endurance Distance Walked 847   Endurance additional comments 3MWT      High Level Balance   High Level Balance Comments TUG 7.18 no device                    [facility] Adult PT Treatment/Exercise - 03/19/16  0001      Knee/Hip Exercises: Stretches   Active Hamstring Stretch Both;2 reps;30 seconds   Active Hamstring Stretch Limitations 12 inch box    Gastroc Stretch Both;3 reps;30 seconds   Gastroc Stretch Limitations gastroc on slantboard      Knee/Hip Exercises: Standing   Rocker Board 2 minutes   Rocker Board Limitations [facility] and lateral                 PT Education - 03/19/16 1159    Education provided Yes   Education Details prognosis, POC, HEP    Person(s) Educated Patient   Methods Demonstration;Explanation   Comprehension Verbalized understanding;Need further instruction          PT Short Term Goals - 03/19/16 1205      PT SHORT TERM GOAL #1   Title Patient to experience pain no more than 5/10 at worst in order to improve QOL and function    Time 3   Period Weeks   Status New     PT SHORT TERM GOAL #2   Title Patient to be independent in edema management strategies, including correct use of ice, retrograde massage, and elevation, in order to reduce swelling and improve ROM    Time 3   Period Weeks   Status New     PT SHORT TERM GOAL #3   Title Patient to be independent in correctly and consistently performing appropriate HEP, to be updated PRN    Time 3   Period Weeks   Status New           PT Long Term Goals - 03/19/16 1208      PT LONG TERM GOAL #1   Title Patient to demonstrate strength 5/5 in all tested muscle groups in order to reduce pain and assist in faciliate return to PLOF    Time 6   Period Weeks   Status New     PT LONG TERM GOAL #2   Title Patient to experience pain no more than 2/10  at worst in order to improve QOL and facilitate return to PLOF    Time 6   Period Weeks   Status New     PT LONG TERM GOAL #3   Title Patient to report he has been able to make full return to PLOF based activities, including regular exercise program, in  order to show improvement of condition and improve QOL    Time 6   Period Weeks   Status New                Plan - 03/19/16 11-25-00    Clinical Impression Statement Patient arrives after having arthroscopic surgery on his R knee on 03/11/16; he states he has been doing fairly well but did have some increased pain a couple of days after the surgery, is now feeling better. Upon examination patient reveals localized edema, regional pain, and functional muscle weakness as primary impairments; incisions appear to be healing well, encouraged patient to protect these areas during showering (or even take a sponge bath) until they are completely healed. Patient will benefit from skilled PT services in order to address functional limitations and assist in reaching optimal level of function.    Rehab Potential Excellent   PT Frequency 2x / week   PT Duration 6 weeks   PT Treatment/Interventions ADLs/Self Care Home Management;Cryotherapy;Moist Heat;Gait training;Stair training;Functional mobility training;Therapeutic activities;Therapeutic exercise;Balance training;Neuromuscular re-education;Patient/family education;Manual techniques;Scar mobilization;Passive range of motion;Energy conservation;Taping   PT Next Visit Plan review HEP and goals; focus on edema management, functional strengthening    PT Home Exercise Plan given    Consulted and Agree with Plan of Care Patient      Patient will benefit from skilled therapeutic intervention in order to improve the following deficits and impairments:  Decreased skin integrity, Pain, Decreased scar mobility, Postural dysfunction, Decreased strength, Decreased balance, Increased edema, Impaired flexibility  Visit Diagnosis: Pain in right knee - Plan: PT plan of care cert/re-cert  Muscle weakness (generalized) - Plan: PT plan of care cert/re-cert  Localized edema - Plan: PT plan of care cert/re-cert      G-Codes - [id number] Nov 26, 1207    Functional Assessment Tool Used Based on skilled clinical assessment of strength, gait, balance, ROM, edema    Functional  Limitation Mobility: Walking and moving around   Mobility: Walking and Moving Around Current Status [phone]) At least 20 percent but less than 40 percent impaired, limited or restricted   Mobility: Walking and Moving Around Goal Status [phone]) At least 1 percent but less than 20 percent impaired, limited or restricted       Problem List Patient Active Problem List   Diagnosis Date Noted  . Medial meniscus, posterior horn derangement   . Rectal bleeding 08/29/2015  . Encounter for screening colonoscopy 08/29/2015    Deniece Ree PT, DPT [place] [address] [place], [place], [address] Phone: [phone]   Fax:  [phone]  Name: TIMOUTHY RITTGERS MRN: [id number] Date of Birth: [birth year]

## 2016-03-19 NOTE — Patient Instructions (Signed)
   HAMSTRING STRETCH - TABLE, BED OR COUCH  Sit on a raised flat surface where you can prop your affected leg up on it such as a treatment table, couch or bed.    While keeping your knee straight to slightly bent, slowly lean forward and reach your hands towards your foot until a gentle stretch is felt along the back of your knee/thigh. Hold and then return to starting position and repeat.  Hold for 30 seconds and repeat twice each side, twice a day.    LAQ/ Long Arc Quad/ Knee Extension with Band  Sit upright at the edge of a seat with foot looped in elastic band. With control, kick the foot out and up to straighten the knee.   Your knee should be pointed towards the ceiling and in line with your foot. Your foot should be flexed at the ankle and the sole of your foot should not turn in or out. Return to starting position with the same level of control  Repeat 10 times each side, twice a day.     Prone Eccentric T-band Hamstring Curls   Lying on your stomach with the theraband around your ankles, keep one leg flat against the table and curl the other knee bringing the foot toward the ceiling. Slowly return to the starting position, controlling the band all the way down.  Repeat 10 times each side, twice a day.

## 2016-03-20 ENCOUNTER — Ambulatory Visit (HOSPITAL_COMMUNITY): Payer: PPO | Admitting: Physical Therapy

## 2016-03-20 DIAGNOSIS — M25561 Pain in right knee: Secondary | ICD-10-CM | POA: Diagnosis not present

## 2016-03-20 DIAGNOSIS — M6281 Muscle weakness (generalized): Secondary | ICD-10-CM

## 2016-03-20 DIAGNOSIS — R600 Localized edema: Secondary | ICD-10-CM

## 2016-03-20 NOTE — Therapy (Signed)
[place] [address] [place], [place], [address] Phone: [phone]   Fax:  [phone]  Physical Therapy Treatment  Patient Details  Name: Ruben Lee MRN: [id number] Date of Birth: 05/14/1951 Referring Provider: Arther Abbott   Encounter Date: 03/20/2016      PT End of Session - 03/20/16 1709    Visit Number 2   Number of Visits 12   Date for PT Re-Evaluation 04/09/16   Authorization Type [employer]    Authorization Time Period 03/19/16 to 04/30/16   Authorization - Visit Number 2   Authorization - Number of Visits 10   PT Start Time 1645   PT Stop Time 1725   PT Time Calculation (min) 40 min   Activity Tolerance Patient tolerated treatment well   Behavior During Therapy [facility] for tasks assessed/performed      Past Medical History:  Diagnosis Date  . Hyperlipidemia   . Hypertension     Past Surgical History:  Procedure Laterality Date  . COLONOSCOPY N/A 09/16/2015   Procedure: COLONOSCOPY;  Surgeon: Danie Binder, MD;  [facility];  Service: Endoscopy;  Laterality: N/A;  1230pm  . KNEE ARTHROSCOPY WITH MEDIAL MENISECTOMY Right 03/11/2016   Procedure: KNEE ARTHROSCOPY WITH MEDIAL MENISECTOMY;  Surgeon: Carole Civil, MD;  Location: [facility];  Service: Orthopedics;  Laterality: Right;  . None to date     As of 08/29/15    There were no vitals filed for this visit.      Subjective Assessment - 03/20/16 1646    Subjective Pt states things are going great. He was a little sore yesterday and today after performing his HEP. He is just a "little" sore right now, but nothing major.    Pertinent History no significant PMH or PSH    How long can you sit comfortably? unlimited    How long can you stand comfortably? unlimited    How long can you walk comfortably? can go long distances but it will ache    Patient Stated Goals get back to PLOF    Currently in Pain? No/denies                          [facility] Adult PT Treatment/Exercise - 03/20/16 0001      Knee/Hip Exercises: Stretches   Active Hamstring Stretch Both;3 reps;30 seconds   Active Hamstring Stretch Limitations 12" box    Gastroc Stretch Both;2 reps;30 seconds   Gastroc Stretch Limitations slant board      Knee/Hip Exercises: Standing   Forward Lunges 2 sets;Right;15 reps   Forward Lunges Limitations 8" box    Forward Step Up 2 sets;15 reps;Hand Hold: 2;Step Height: 6"   Step Down 2 sets;15 reps;Hand Hold: 2;Step Height: 6"   Functional Squat 2 sets;15 reps   Functional Squat Limitations no UE support      Knee/Hip Exercises: Supine   Bridges Limitations 2x15 reps    Other Supine Knee/Hip Exercises straight leg bridge 2x15 on 6" step with foam                 PT Education - 03/20/16 1709    Education provided Yes   Education Details reviewed goals/updated HEP; encouraged pt to apply ice/elevate when he gets home to minimize swelling    Person(s) Educated Patient   Methods Handout;Explanation   Comprehension Verbalized understanding          PT Short  Term Goals - 2016-03-20 1205      PT SHORT TERM GOAL #1   Title Patient to experience pain no more than 5/10 at worst in order to improve QOL and function    Time 3   Period Weeks   Status New     PT SHORT TERM GOAL #2   Title Patient to be independent in edema management strategies, including correct use of ice, retrograde massage, and elevation, in order to reduce swelling and improve ROM    Time 3   Period Weeks   Status New     PT SHORT TERM GOAL #3   Title Patient to be independent in correctly and consistently performing appropriate HEP, to be updated PRN    Time 3   Period Weeks   Status New           PT Long Term Goals - 2016-03-20 11/17/1206      PT LONG TERM GOAL #1   Title Patient to demonstrate strength 5/5 in all tested muscle groups in order to reduce pain and assist in faciliate return to PLOF     Time 6   Period Weeks   Status New     PT LONG TERM GOAL #2   Title Patient to experience pain no more than 2/10  at worst in order to improve QOL and facilitate return to PLOF    Time 6   Period Weeks   Status New     PT LONG TERM GOAL #3   Title Patient to report he has been able to make full return to PLOF based activities, including regular exercise program, in order to show improvement of condition and improve QOL    Time 6   Period Weeks   Status New               Plan - 03/20/16 1722    Clinical Impression Statement Pt arrived today having made good progress towards his goals, reporting minimal soreness. He is able to perform all exercises with minimal increase in pain. I noted increased weight shift to the Lt during sit to stand transitions, and encouraged him to increase awareness of this throughout the day. Will continue with current POC.   Rehab Potential Excellent   PT Frequency 2x / week   PT Duration 6 weeks   PT Treatment/Interventions ADLs/Self Care Home Management;Cryotherapy;Moist Heat;Gait training;Stair training;Functional mobility training;Therapeutic activities;Therapeutic exercise;Balance training;Neuromuscular re-education;Patient/family education;Manual techniques;Scar mobilization;Passive range of motion;Energy conservation;Taping   PT Next Visit Plan progress fucntional strength, STM along distal quad/hamstring to decrease spasm as needed   PT Home Exercise Plan addition of sit to stand   Consulted and Agree with Plan of Care Patient      Patient will benefit from skilled therapeutic intervention in order to improve the following deficits and impairments:  Decreased skin integrity, Pain, Decreased scar mobility, Postural dysfunction, Decreased strength, Decreased balance, Increased edema, Impaired flexibility  Visit Diagnosis: Pain in right knee  Muscle weakness (generalized)  Localized edema       G-Codes - 2016/03/20 1207-11-17    Functional  Assessment Tool Used Based on skilled clinical assessment of strength, gait, balance, ROM, edema    Functional Limitation Mobility: Walking and moving around   Mobility: Walking and Moving Around Current Status [id number]) At least 20 percent but less than 40 percent impaired, limited or restricted   Mobility: Walking and Moving Around Goal Status [id number]) At least 1 percent but less than 20 percent impaired, limited or  restricted      Problem List Patient Active Problem List   Diagnosis Date Noted  . Medial meniscus, posterior horn derangement   . Rectal bleeding 08/29/2015  . Encounter for screening colonoscopy 08/29/2015   5:26 PM,03/20/16 Elly Modena PT, DPT [facility] [address] [place], [place], [address] Phone: [phone]   Fax:  [phone]  Name: Ruben Lee MRN: [id number] Date of Birth: 1950-10-10

## 2016-03-25 ENCOUNTER — Ambulatory Visit (HOSPITAL_COMMUNITY): Payer: PPO

## 2016-03-25 DIAGNOSIS — M25561 Pain in right knee: Secondary | ICD-10-CM | POA: Diagnosis not present

## 2016-03-25 DIAGNOSIS — M6281 Muscle weakness (generalized): Secondary | ICD-10-CM

## 2016-03-25 DIAGNOSIS — R600 Localized edema: Secondary | ICD-10-CM

## 2016-03-25 NOTE — Therapy (Signed)
[place] [address] [place], [place], [address] Phone: [phone]   Fax:  [phone]  Physical Therapy Treatment  Patient Details  Name: Ruben Lee MRN: [id number] Date of Birth: 07/20/1951 Referring Provider: Arther Abbott   Encounter Date: 03/25/2016      PT End of Session - 03/25/16 1613    Visit Number 3   Number of Visits 12   Authorization Type [employer]    Authorization Time Period 03/19/16 to 04/30/16   Authorization - Visit Number 3   Authorization - Number of Visits 10   PT Start Time [id number]   PT Stop Time 1653   PT Time Calculation (min) 46 min   Activity Tolerance Patient tolerated treatment well   Behavior During Therapy [facility] for tasks assessed/performed      Past Medical History:  Diagnosis Date  . Hyperlipidemia   . Hypertension     Past Surgical History:  Procedure Laterality Date  . COLONOSCOPY N/A 09/16/2015   Procedure: COLONOSCOPY;  Surgeon: Danie Binder, MD;  [facility];  Service: Endoscopy;  Laterality: N/A;  1230pm  . KNEE ARTHROSCOPY WITH MEDIAL MENISECTOMY Right 03/11/2016   Procedure: KNEE ARTHROSCOPY WITH MEDIAL MENISECTOMY;  Surgeon: Carole Civil, MD;  Location: [facility];  Service: Orthopedics;  Laterality: Right;  . None to date     As of 08/29/15    There were no vitals filed for this visit.      Subjective Assessment - 03/25/16 1612    Subjective Pt stated he is doing great today, no reports of pain just a little soreness.  Has been compliant with HEP daily.   Pertinent History no significant PMH or PSH    Patient Stated Goals get back to PLOF    Currently in Pain? No/denies            [facility] Adult PT Treatment/Exercise - 03/25/16 0001      Knee/Hip Exercises: Stretches   Active Hamstring Stretch Both;3 reps;30 seconds   Active Hamstring Stretch Limitations 12" box    Gastroc Stretch Both;2 reps;30 seconds   Gastroc Stretch Limitations slant  board      Knee/Hip Exercises: Standing   Forward Lunges Both;15 reps   Forward Lunges Limitations 8" box    Lateral Step Up 15 reps;Hand Hold: 1;Step Height: 6"   Forward Step Up 15 reps;Hand Hold: 1;Step Height: 6"   Step Down 15 reps;Hand Hold: 1;Step Height: 6"   Functional Squat 2 sets;15 reps   Functional Squat Limitations no UE support      Knee/Hip Exercises: Supine   Quad Sets 10 reps   Bridges Limitations 2x15 reps    Patellar Mobs complete and instructed     Knee/Hip Exercises: Prone   Prone Knee Hang 3 minutes   Prone Knee Hang Limitations manual complete to hamstrings     Manual Therapy   Manual Therapy Soft tissue mobilization;Joint mobilization   Manual therapy comments manual complete separate than rest of tx   Joint Mobilization patella mobs complete all directions; good mobility   Soft tissue mobilization hamstrings 3 min during Prone knee hang then 5 minutes following on mat to reduce tightness; distal quadriceps to reduce tightness                  PT Short Term Goals - 03/19/16 1205      PT SHORT TERM GOAL #1   Title Patient to experience pain no more than  5/10 at worst in order to improve QOL and function    Time 3   Period Weeks   Status New     PT SHORT TERM GOAL #2   Title Patient to be independent in edema management strategies, including correct use of ice, retrograde massage, and elevation, in order to reduce swelling and improve ROM    Time 3   Period Weeks   Status New     PT SHORT TERM GOAL #3   Title Patient to be independent in correctly and consistently performing appropriate HEP, to be updated PRN    Time 3   Period Weeks   Status New           PT Long Term Goals - 03/19/16 1208      PT LONG TERM GOAL #1   Title Patient to demonstrate strength 5/5 in all tested muscle groups in order to reduce pain and assist in faciliate return to PLOF    Time 6   Period Weeks   Status New     PT LONG TERM GOAL #2   Title  Patient to experience pain no more than 2/10  at worst in order to improve QOL and facilitate return to PLOF    Time 6   Period Weeks   Status New     PT LONG TERM GOAL #3   Title Patient to report he has been able to make full return to PLOF based activities, including regular exercise program, in order to show improvement of condition and improve QOL    Time 6   Period Weeks   Status New               Plan - 03/25/16 1744    Clinical Impression Statement Session focus on functional strenghtening and began manual to address tight restrictions with distal quadriceps and hamstrings.  Pt able to complete all exercises with good form with min cueing for form and technqiues.  Added prone knee hang to address knee extension and manual technqiues to address tightness.  Pt reports some knee popping during gait and standing, pt educated on anatomy of patella and instructed patella mobs to improve mobiltiiy, minimal restrictions noted.  No reports of pain through session.     Rehab Potential Excellent   PT Frequency 2x / week   PT Duration 6 weeks   PT Treatment/Interventions ADLs/Self Care Home Management;Cryotherapy;Moist Heat;Gait training;Stair training;Functional mobility training;Therapeutic activities;Therapeutic exercise;Balance training;Neuromuscular re-education;Patient/family education;Manual techniques;Scar mobilization;Passive range of motion;Energy conservation;Taping   PT Next Visit Plan progress fucntional strength, STM along distal quad/hamstring to decrease spasm as needed      Patient will benefit from skilled therapeutic intervention in order to improve the following deficits and impairments:  Decreased skin integrity, Pain, Decreased scar mobility, Postural dysfunction, Decreased strength, Decreased balance, Increased edema, Impaired flexibility  Visit Diagnosis: Pain in right knee  Muscle weakness (generalized)  Localized edema     Problem List Patient Active  Problem List   Diagnosis Date Noted  . Medial meniscus, posterior horn derangement   . Rectal bleeding 08/29/2015  . Encounter for screening colonoscopy 08/29/2015   Ihor Austin, [place]; [place]  Aldona Lento 03/25/2016, 5:49 PM  [facility] [address] [place], [place], [address] Phone: [phone]   Fax:  [phone]  Name: Ruben Lee MRN: [id number] Date of Birth: 05/25/1951

## 2016-03-26 ENCOUNTER — Ambulatory Visit (HOSPITAL_COMMUNITY): Payer: PPO

## 2016-03-26 DIAGNOSIS — M6281 Muscle weakness (generalized): Secondary | ICD-10-CM

## 2016-03-26 DIAGNOSIS — M25561 Pain in right knee: Secondary | ICD-10-CM

## 2016-03-26 DIAGNOSIS — R600 Localized edema: Secondary | ICD-10-CM

## 2016-03-26 NOTE — Therapy (Signed)
[facility] [address] [place], [place], [address] Phone: [phone]   Fax:  [phone]  Physical Therapy Treatment  Patient Details  Name: Ruben Lee MRN: [id number] Date of Birth: 07/24/1951 Referring Provider: Arther Abbott   Encounter Date: 03/26/2016      PT End of Session - 03/26/16 0907    Visit Number 4   Number of Visits 12   Date for PT Re-Evaluation 04/09/16   Authorization Type [employer]    Authorization Time Period 03/19/16 to 04/30/16   Authorization - Visit Number 4   Authorization - Number of Visits 10   PT Start Time 0904   PT Stop Time 0944   PT Time Calculation (min) 40 min   Activity Tolerance Patient tolerated treatment well   Behavior During Therapy [facility] for tasks assessed/performed      Past Medical History:  Diagnosis Date  . Hyperlipidemia   . Hypertension     Past Surgical History:  Procedure Laterality Date  . COLONOSCOPY N/A 09/16/2015   Procedure: COLONOSCOPY;  Surgeon: Danie Binder, MD;  [facility];  Service: Endoscopy;  Laterality: N/A;  1230pm  . KNEE ARTHROSCOPY WITH MEDIAL MENISECTOMY Right 03/11/2016   Procedure: KNEE ARTHROSCOPY WITH MEDIAL MENISECTOMY;  Surgeon: Carole Civil, MD;  Location: [facility];  Service: Orthopedics;  Laterality: Right;  . None to date     As of 08/29/15    There were no vitals filed for this visit.      Subjective Assessment - 03/26/16 0907    Subjective Pt stated he is feeling good today, reports he walked a mile last night.  No reports of pain today.   Pertinent History no significant PMH or PSH    Patient Stated Goals get back to PLOF    Currently in Pain? No/denies                         [facility] Adult PT Treatment/Exercise - 03/26/16 0001      Knee/Hip Exercises: Standing   Forward Lunges Both;15 reps   Forward Lunges Limitations 6in step   Terminal Knee Extension Limitations RTB 15x 5"   Lateral  Step Up 15 reps;Hand Hold: 1;Step Height: 6"   Forward Step Up 15 reps;Hand Hold: 0;Step Height: 6"   Step Down 15 reps;Hand Hold: 1;Step Height: 6"   Functional Squat 2 sets;15 reps   Functional Squat Limitations no UE support    SLS Rt 47", Lt 60" 1st rep     Soft tissue mobilization Manual complete separate rest of tx. Patella mobility- good mobility  hamstrings 3 min during Prone knee hang then 5 minutes following on mat to reduce tightness; distal quadriceps to reduce tightness            PT Short Term Goals - 03/19/16 1205      PT SHORT TERM GOAL #1   Title Patient to experience pain no more than 5/10 at worst in order to improve QOL and function    Time 3   Period Weeks   Status New     PT SHORT TERM GOAL #2   Title Patient to be independent in edema management strategies, including correct use of ice, retrograde massage, and elevation, in order to reduce swelling and improve ROM    Time 3   Period Weeks   Status New     PT SHORT TERM GOAL #3   Title  Patient to be independent in correctly and consistently performing appropriate HEP, to be updated PRN    Time 3   Period Weeks   Status New           PT Long Term Goals - 03/19/16 1208      PT LONG TERM GOAL #1   Title Patient to demonstrate strength 5/5 in all tested muscle groups in order to reduce pain and assist in faciliate return to PLOF    Time 6   Period Weeks   Status New     PT LONG TERM GOAL #2   Title Patient to experience pain no more than 2/10  at worst in order to improve QOL and facilitate return to PLOF    Time 6   Period Weeks   Status New     PT LONG TERM GOAL #3   Title Patient to report he has been able to make full return to PLOF based activities, including regular exercise program, in order to show improvement of condition and improve QOL    Time 6   Period Weeks   Status New               Plan - 03/26/16 [id number]    Clinical Impression Statement Continues session focus  on functional strengthening and manual techniques to address tight restrictions with distal quadriceps and hamstrings.  Added TKE to improve knee extension and SLS for stability.  Pt able to complete all exercises wtih good form with minimal cueing required.  Pt reports independence with patella mobs at home wiht reports of decreased popping during gait.  Reports he increased to walking a mile multiple times a day.  No reports of pain through session.   Rehab Potential Excellent   PT Frequency 2x / week   PT Duration 6 weeks   PT Treatment/Interventions ADLs/Self Care Home Management;Cryotherapy;Moist Heat;Gait training;Stair training;Functional mobility training;Therapeutic activities;Therapeutic exercise;Balance training;Neuromuscular re-education;Patient/family education;Manual techniques;Scar mobilization;Passive range of motion;Energy conservation;Taping   PT Next Visit Plan progress fucntional strength, STM along distal quad/hamstring to decrease spasm as needed      Patient will benefit from skilled therapeutic intervention in order to improve the following deficits and impairments:  Decreased skin integrity, Pain, Decreased scar mobility, Postural dysfunction, Decreased strength, Decreased balance, Increased edema, Impaired flexibility  Visit Diagnosis: Pain in right knee  Muscle weakness (generalized)  Localized edema     Problem List Patient Active Problem List   Diagnosis Date Noted  . Medial meniscus, posterior horn derangement   . Rectal bleeding 08/29/2015  . Encounter for screening colonoscopy 08/29/2015   Ihor Austin, [place]; [place]  Aldona Lento 03/26/2016, 11:17 AM  [facility] [address] [place], [place], [address] Phone: [phone]   Fax:  [phone]  Name: Ruben Lee MRN: [id number] Date of Birth: 18-Nov-1950

## 2016-03-27 ENCOUNTER — Encounter (HOSPITAL_COMMUNITY): Payer: Commercial Managed Care - HMO

## 2016-03-30 ENCOUNTER — Ambulatory Visit (HOSPITAL_COMMUNITY): Payer: PPO | Admitting: Physical Therapy

## 2016-03-30 DIAGNOSIS — M6281 Muscle weakness (generalized): Secondary | ICD-10-CM

## 2016-03-30 DIAGNOSIS — R600 Localized edema: Secondary | ICD-10-CM

## 2016-03-30 DIAGNOSIS — M25561 Pain in right knee: Secondary | ICD-10-CM

## 2016-03-30 NOTE — Therapy (Signed)
[facility] [address] [place], [place], [address] Phone: [phone]   Fax:  [phone]  Physical Therapy Treatment  Patient Details  Name: Ruben Lee MRN: [id number] Date of Birth: 05/16/1951 Referring Provider: Arther Abbott   Encounter Date: 03/30/2016      PT End of Session - 03/30/16 0929    Visit Number 5   Number of Visits 12   Date for PT Re-Evaluation 04/09/16   Authorization Type [employer]    Authorization Time Period 03/19/16 to 04/30/16   Authorization - Visit Number 5   Authorization - Number of Visits 10   PT Start Time 0817   PT Stop Time 0900   PT Time Calculation (min) 43 min   Activity Tolerance Patient tolerated treatment well   Behavior During Therapy [facility] for tasks assessed/performed      Past Medical History:  Diagnosis Date  . Hyperlipidemia   . Hypertension     Past Surgical History:  Procedure Laterality Date  . COLONOSCOPY N/A 09/16/2015   Procedure: COLONOSCOPY;  Surgeon: Danie Binder, MD;  [facility];  Service: Endoscopy;  Laterality: N/A;  1230pm  . KNEE ARTHROSCOPY WITH MEDIAL MENISECTOMY Right 03/11/2016   Procedure: KNEE ARTHROSCOPY WITH MEDIAL MENISECTOMY;  Surgeon: Carole Civil, MD;  Location: [facility];  Service: Orthopedics;  Laterality: Right;  . None to date     As of 08/29/15    There were no vitals filed for this visit.      Subjective Assessment - 03/30/16 0831    Subjective Pt states he is only have mild discomfort in his Rt knee, usually around his knee cap area at a 2/10.  States he is up to approx 5 miles per day walking 1 1/2 mile bouts 3X daily and doing well with this.     Currently in Pain? Yes   Pain Score 2    Pain Location Knee   Pain Orientation Right   Pain Descriptors / Indicators Aching   Pain Type Surgical pain   Pain Radiating Towards from Rt knee arthroscopy                         [facility] Adult PT  Treatment/Exercise - 03/30/16 0001      Knee/Hip Exercises: Stretches   Active Hamstring Stretch Both;3 reps;30 seconds   Active Hamstring Stretch Limitations 12" box    Gastroc Stretch Both;2 reps;30 seconds   Gastroc Stretch Limitations slant board      Knee/Hip Exercises: Standing   Forward Lunges Both;15 reps   Forward Lunges Limitations 4in step   Terminal Knee Extension Limitations RTB 15x 5"   Lateral Step Up 15 reps;Hand Hold: 1;Step Height: 6"   Forward Step Up 15 reps;Hand Hold: 0;Step Height: 6"   Step Down 15 reps;Hand Hold: 1;Step Height: 6"   Functional Squat 2 sets;15 reps   Functional Squat Limitations no UE support    SLS 1 minute bilaterally no UE assist     Knee/Hip Exercises: Supine   Knee Extension AROM;Right   Knee Extension Limitations 3   Knee Flexion AROM;Right   Knee Flexion Limitations 125 degrees (Lt knee is 135 degrees)     Knee/Hip Exercises: Prone   Prone Knee Hang 3 minutes   Prone Knee Hang Limitations manual complete to hamstrings     Manual Therapy   Manual Therapy Soft tissue mobilization;Joint mobilization   Manual therapy comments manual  complete separate than rest of tx   Joint Mobilization patella mobs complete all directions; good mobility   Soft tissue mobilization hamstrings 3 min during Prone knee hang then 5 minutes following on mat to reduce tightness; distal quadriceps to reduce tightness                  PT Short Term Goals - 03/19/16 1205      PT SHORT TERM GOAL #1   Title Patient to experience pain no more than 5/10 at worst in order to improve QOL and function    Time 3   Period Weeks   Status New     PT SHORT TERM GOAL #2   Title Patient to be independent in edema management strategies, including correct use of ice, retrograde massage, and elevation, in order to reduce swelling and improve ROM    Time 3   Period Weeks   Status New     PT SHORT TERM GOAL #3   Title Patient to be independent in correctly  and consistently performing appropriate HEP, to be updated PRN    Time 3   Period Weeks   Status New           PT Long Term Goals - 03/19/16 1208      PT LONG TERM GOAL #1   Title Patient to demonstrate strength 5/5 in all tested muscle groups in order to reduce pain and assist in faciliate return to PLOF    Time 6   Period Weeks   Status New     PT LONG TERM GOAL #2   Title Patient to experience pain no more than 2/10  at worst in order to improve QOL and facilitate return to PLOF    Time 6   Period Weeks   Status New     PT LONG TERM GOAL #3   Title Patient to report he has been able to make full return to PLOF based activities, including regular exercise program, in order to show improvement of condition and improve QOL    Time 6   Period Weeks   Status New               Plan - 03/30/16 0931    Clinical Impression Statement continued focus on functional stregntheing.  ROM is now 3-125 degrees, however his Lt knee normal ROM is 0-135 degrees.  Pt able to SLS on both LE's for 1 minute and able to drop to 4" step with lunges all without difficulty.  Pt with most tightness posterior medial knee and able to loosen adhesions with manual techniques.    Rehab Potential Excellent   PT Frequency 2x / week   PT Duration 6 weeks   PT Treatment/Interventions ADLs/Self Care Home Management;Cryotherapy;Moist Heat;Gait training;Stair training;Functional mobility training;Therapeutic activities;Therapeutic exercise;Balance training;Neuromuscular re-education;Patient/family education;Manual techniques;Scar mobilization;Passive range of motion;Energy conservation;Taping   PT Next Visit Plan progress fucntional strength, STM along distal quad/hamstring to decrease spasm as needed   PT Home Exercise Plan Progress SLS to foam surface and begin stair negotiation reciprocally.        Patient will benefit from skilled therapeutic intervention in order to improve the following deficits and  impairments:  Decreased skin integrity, Pain, Decreased scar mobility, Postural dysfunction, Decreased strength, Decreased balance, Increased edema, Impaired flexibility  Visit Diagnosis: Pain in right knee  Muscle weakness (generalized)  Localized edema     Problem List Patient Active Problem List   Diagnosis Date Noted  . Medial  meniscus, posterior horn derangement   . Rectal bleeding 08/29/2015  . Encounter for screening colonoscopy 08/29/2015    Teena Irani 03/30/2016, 9:49 AM  [facility] [address] [place], [place], [address] Phone: [phone]   Fax:  [phone]  Name: Ruben Lee MRN: [id number] Date of Birth: 09-Feb-1951

## 2016-04-01 ENCOUNTER — Telehealth (HOSPITAL_COMMUNITY): Payer: Self-pay | Admitting: Physical Therapy

## 2016-04-01 NOTE — Telephone Encounter (Signed)
Called patient and asked him to change times as PT has a meeting at [facility] on 04/02/16. Patient agreeable to coming to appointment at 9:45 on 04/02/16 instead.  Deniece Ree PT, DPT [phone]

## 2016-04-02 ENCOUNTER — Ambulatory Visit (HOSPITAL_COMMUNITY): Payer: PPO | Admitting: Physical Therapy

## 2016-04-02 DIAGNOSIS — M25561 Pain in right knee: Secondary | ICD-10-CM | POA: Diagnosis not present

## 2016-04-02 DIAGNOSIS — M6281 Muscle weakness (generalized): Secondary | ICD-10-CM

## 2016-04-02 DIAGNOSIS — R600 Localized edema: Secondary | ICD-10-CM

## 2016-04-02 NOTE — Therapy (Signed)
[facility] [address] [place], [place], [address] Phone: [phone]   Fax:  [phone]  Physical Therapy Treatment  Patient Details  Name: Ruben Lee MRN: [id number] Date of Birth: 12-19-1950 Referring Provider: Arther Abbott   Encounter Date: 04/02/2016      PT End of Session - 04/02/16 1042    Visit Number 6   Number of Visits 12   Date for PT Re-Evaluation 04/09/16   Authorization Type [employer]    Authorization Time Period 03/19/16 to 04/30/16   Authorization - Visit Number 6   Authorization - Number of Visits 10   PT Start Time 0948   PT Stop Time 1030   PT Time Calculation (min) 42 min   Activity Tolerance Patient tolerated treatment well   Behavior During Therapy [facility] for tasks assessed/performed      Past Medical History:  Diagnosis Date  . Hyperlipidemia   . Hypertension     Past Surgical History:  Procedure Laterality Date  . COLONOSCOPY N/A 09/16/2015   Procedure: COLONOSCOPY;  Surgeon: Danie Binder, MD;  [facility];  Service: Endoscopy;  Laterality: N/A;  1230pm  . KNEE ARTHROSCOPY WITH MEDIAL MENISECTOMY Right 03/11/2016   Procedure: KNEE ARTHROSCOPY WITH MEDIAL MENISECTOMY;  Surgeon: Carole Civil, MD;  Location: [facility];  Service: Orthopedics;  Laterality: Right;  . None to date     As of 08/29/15    There were no vitals filed for this visit.      Subjective Assessment - 04/02/16 0958    Subjective Pt states he is only have mild discomfort in his Rt knee, usually around his knee cap area at a 2/10.  States he is up to approx 5 miles per day walking 1 1/2 mile bouts 3X daily and doing well with this.     Currently in Pain? No/denies                         [facility] Adult PT Treatment/Exercise - 04/02/16 0001      Knee/Hip Exercises: Stretches   Active Hamstring Stretch Both;3 reps;30 seconds   Active Hamstring Stretch Limitations 12" box    Gastroc  Stretch Both;2 reps;30 seconds   Gastroc Stretch Limitations slant board      Knee/Hip Exercises: Standing   Forward Lunges Both;15 reps   Forward Lunges Limitations 4in step no UE's   Lateral Step Up 15 reps;Hand Hold: 1;Step Height: 6"   Forward Step Up 15 reps;Hand Hold: 0;Step Height: 6"   Functional Squat 2 sets;15 reps   Functional Squat Limitations no UE support    Stairs 7" stairwell without UE 2RT reciprocally   SLS foam 1 minute Lt, 45" RT     Knee/Hip Exercises: Supine   Knee Extension AROM;Right   Knee Extension Limitations 0   Knee Flexion AROM;Right   Knee Flexion Limitations 135 degrees (Lt knee is 135 degrees)     Knee/Hip Exercises: Prone   Prone Knee Hang 3 minutes                  PT Short Term Goals - 04/02/16 1133      PT SHORT TERM GOAL #1   Title Patient to experience pain no more than 5/10 at worst in order to improve QOL and function    Time 3   Period Weeks   Status Achieved     PT SHORT TERM GOAL #2  Title Patient to be independent in edema management strategies, including correct use of ice, retrograde massage, and elevation, in order to reduce swelling and improve ROM    Time 3   Period Weeks   Status Achieved     PT SHORT TERM GOAL #3   Title Patient to be independent in correctly and consistently performing appropriate HEP, to be updated PRN    Time 3   Period Weeks   Status Achieved           PT Long Term Goals - 04/02/16 1133      PT LONG TERM GOAL #1   Title Patient to demonstrate strength 5/5 in all tested muscle groups in order to reduce pain and assist in faciliate return to PLOF    Time 6   Period Weeks   Status On-going     PT LONG TERM GOAL #2   Title Patient to experience pain no more than 2/10  at worst in order to improve QOL and facilitate return to PLOF    Time 6   Period Weeks   Status On-going     PT LONG TERM GOAL #3   Title Patient to report he has been able to make full return to PLOF based  activities, including regular exercise program, in order to show improvement of condition and improve QOL    Time 6   Period Weeks   Status On-going               Plan - 04/02/16 1134    Clinical Impression Statement Pt overall progressing well with minimal discomfort and complaince with HEP.  Pt is motivated and progressing self in his walking program ato home.  This is the only area where he is still having difficulty.  Pt reports when he increases his speed or walks a greater distance he begins to have medial knee pain.  Otherwise, he is functioning normally without any difficulty.  Pt able to negotiate stairs this session reciprocally without HR in good form wtihout pain.  No other immediate difficulties voiced or noted.  Returns to MD week after next.    Rehab Potential Excellent   PT Frequency 2x / week   PT Duration 6 weeks   PT Treatment/Interventions ADLs/Self Care Home Management;Cryotherapy;Moist Heat;Gait training;Stair training;Functional mobility training;Therapeutic activities;Therapeutic exercise;Balance training;Neuromuscular re-education;Patient/family education;Manual techniques;Scar mobilization;Passive range of motion;Energy conservation;Taping   PT Next Visit Plan Anticipate discharge next session.      Patient will benefit from skilled therapeutic intervention in order to improve the following deficits and impairments:  Decreased skin integrity, Pain, Decreased scar mobility, Postural dysfunction, Decreased strength, Decreased balance, Increased edema, Impaired flexibility  Visit Diagnosis: Pain in right knee  Muscle weakness (generalized)  Localized edema     Problem List Patient Active Problem List   Diagnosis Date Noted  . Medial meniscus, posterior horn derangement   . Rectal bleeding 08/29/2015  . Encounter for screening colonoscopy 08/29/2015    Teena Irani, PTA/CLT [id number] 04/02/2016, 11:41 AM  [facility] [address] [place], [place], [address] Phone: [phone]   Fax:  [phone]  Name: CAVION RIDLEY MRN: [id number] Date of Birth: Dec 10, 1950

## 2016-04-03 DIAGNOSIS — E782 Mixed hyperlipidemia: Secondary | ICD-10-CM | POA: Diagnosis not present

## 2016-04-03 DIAGNOSIS — E119 Type 2 diabetes mellitus without complications: Secondary | ICD-10-CM | POA: Diagnosis not present

## 2016-04-06 ENCOUNTER — Encounter (HOSPITAL_COMMUNITY): Payer: Commercial Managed Care - HMO | Admitting: Physical Therapy

## 2016-04-07 ENCOUNTER — Encounter (HOSPITAL_COMMUNITY): Payer: Commercial Managed Care - HMO

## 2016-04-07 DIAGNOSIS — I10 Essential (primary) hypertension: Secondary | ICD-10-CM | POA: Diagnosis not present

## 2016-04-07 DIAGNOSIS — E782 Mixed hyperlipidemia: Secondary | ICD-10-CM | POA: Diagnosis not present

## 2016-04-07 DIAGNOSIS — M23209 Derangement of unspecified meniscus due to old tear or injury, unspecified knee: Secondary | ICD-10-CM | POA: Diagnosis not present

## 2016-04-07 DIAGNOSIS — E119 Type 2 diabetes mellitus without complications: Secondary | ICD-10-CM | POA: Diagnosis not present

## 2016-04-09 ENCOUNTER — Ambulatory Visit (HOSPITAL_COMMUNITY): Payer: PPO | Admitting: Physical Therapy

## 2016-04-09 DIAGNOSIS — M6281 Muscle weakness (generalized): Secondary | ICD-10-CM

## 2016-04-09 DIAGNOSIS — M25561 Pain in right knee: Secondary | ICD-10-CM

## 2016-04-09 DIAGNOSIS — R600 Localized edema: Secondary | ICD-10-CM

## 2016-04-09 NOTE — Therapy (Signed)
[facility] [address] [place], [place], [address] Phone: [phone]   Fax:  [phone]  Physical Therapy Treatment (Discharge)  Patient Details  Name: Ruben Lee MRN: [id number] Date of Birth: 01/21/1951 Referring Provider: Arther Abbott   Encounter Date: 04/09/2016      PT End of Session - 04/09/16 0842    Visit Number 7   Number of Visits 7   Authorization Type [employer] (G-codes done 7th session)   Authorization Time Period 03/19/16 to 04/30/16   Authorization - Visit Number 7   Authorization - Number of Visits 10   PT Start Time [phone]   PT Stop Time 0840  no further skilled services needed, patient at very high level of function. DC today.    PT Time Calculation (min) 23 min   Activity Tolerance Patient tolerated treatment well   Behavior During Therapy WFL for tasks assessed/performed      Past Medical History:  Diagnosis Date  . Hyperlipidemia   . Hypertension     Past Surgical History:  Procedure Laterality Date  . COLONOSCOPY N/A 09/16/2015   Procedure: COLONOSCOPY;  Surgeon: Danie Binder, MD;  Location: [facility];  Service: Endoscopy;  Laterality: N/A;  1230pm  . KNEE ARTHROSCOPY WITH MEDIAL MENISECTOMY Right 03/11/2016   Procedure: KNEE ARTHROSCOPY WITH MEDIAL MENISECTOMY;  Surgeon: Carole Civil, MD;  Location: [facility];  Service: Orthopedics;  Laterality: Right;  . None to date     As of 08/29/15    There were no vitals filed for this visit.      Subjective Assessment - 04/09/16 0818    Subjective Patient arrives stating he is really doing great right now, he is back to his baseline and can do everything he needs and wants, no limitations. He does have some soreness in his knee but that is it. No falls or close calls recently.    Pertinent History no significant PMH or PSH    How long can you sit comfortably? 8/24- unlimited    How long can you stand comfortably? 8/24- unlimited    How long can you walk comfortably? 8/24- unlimited    Patient Stated Goals get back to PLOF    Currently in Pain? No/denies            [facility] PT Assessment - 04/09/16 0001      Observation/Other Assessments   Focus on Therapeutic Outcomes (FOTO)  1% limited      AROM   Right Knee Extension 1   Right Knee Flexion 131     Strength   Right Hip Flexion 4+/5   Right Hip Extension 4+/5   Right Hip ABduction 5/5   Left Hip Flexion 4+/5   Left Hip Extension 4-/5   Left Hip ABduction 5/5   Right Knee Flexion 5/5   Right Knee Extension 5/5   Left Knee Flexion 5/5   Left Knee Extension 5/5   Right Ankle Dorsiflexion 5/5   Left Ankle Dorsiflexion 5/5     6 minute walk test results    Aerobic Endurance Distance Walked 1282   Endurance additional comments 3MWT      High Level Balance   High Level Balance Comments TUG 6.29 no device                              PT Education - 04/09/16 0841    Education provided Yes  Education Details progress with skilled PT services, DC today; finalized HEP/exercise recommendation; [employer], walking club    Person(s) Educated Patient   Methods Explanation   Comprehension Verbalized understanding          PT Short Term Goals - 04/09/16 0832      PT SHORT TERM GOAL #1   Title Patient to experience pain no more than 5/10 at worst in order to improve QOL and function    Baseline 8/24- 2/10 at worst    Time 3   Period Weeks   Status Achieved     PT SHORT TERM GOAL #2   Title Patient to be independent in edema management strategies, including correct use of ice, retrograde massage, and elevation, in order to reduce swelling and improve ROM    Time 3   Period Weeks   Status Achieved     PT SHORT TERM GOAL #3   Title Patient to be independent in correctly and consistently performing appropriate HEP, to be updated PRN    Baseline 8/24- reports compliance    Time 3   Period Weeks   Status Achieved           PT  Long Term Goals - 04/09/16 3716      PT LONG TERM GOAL #1   Title Patient to demonstrate strength 5/5 in all tested muscle groups in order to reduce pain and assist in faciliate return to PLOF    Baseline 8/24- excellent progress    Time 6   Period Weeks   Status Partially Met     PT LONG TERM GOAL #2   Title Patient to experience pain no more than 2/10  at worst in order to improve QOL and facilitate return to PLOF    Time 6   Period Weeks   Status Achieved     PT LONG TERM GOAL #3   Title Patient to report he has been able to make full return to PLOF based activities, including regular exercise program, in order to show improvement of condition and improve QOL    Baseline 8/24- reports he is at [facility], including regular exercise program    Time 6   Period Weeks   Status Partially Met               Plan - 04/09/16 0843    Clinical Impression Statement Re-assessment performed today. Patient arrives today stating he is able to do everything he needs and wants at this time, he has no difficulty or concerns regarding his knee despite some occasional soreness and he has remained compliant with HEP/edema control regimen. Upon examination patient reveals excellent progress and scoring on all functional objective tests with only measure of slight concern being hip extensor strength, however this has progressed well with PT and will likely continue to improve with regular activity. Discussed HEP and regular aerobic exercise regimen, which remain appropriate for patient and to which he remains fully compliant; patient refuses [employer] waiver but was given information regarding local walking club. DC today due to high level of function/patient return to baseline status.    Rehab Potential Excellent   PT Treatment/Interventions ADLs/Self Care Home Management;Cryotherapy;Moist Heat;Gait training;Stair training;Functional mobility training;Therapeutic activities;Therapeutic exercise;Balance  training;Neuromuscular re-education;Patient/family education;Manual techniques;Scar mobilization;Passive range of motion;Energy conservation;Taping   PT Next Visit Plan DC today    Consulted and Agree with Plan of Care Patient      Patient will benefit from skilled therapeutic intervention in order to improve the following deficits  and impairments:  Decreased skin integrity, Pain, Decreased scar mobility, Postural dysfunction, Decreased strength, Decreased balance, Increased edema, Impaired flexibility  Visit Diagnosis: Pain in right knee  Muscle weakness (generalized)  Localized edema       G-Codes - April 19, 2016 0846    Functional Assessment Tool Used Based on skilled clinical assessment of strength, gait, balance, ROM, edema    Functional Limitation Mobility: Walking and moving around   Mobility: Walking and Moving Around Goal Status [phone]) 0 percent impaired, limited or restricted   Mobility: Walking and Moving Around Discharge Status [phone]) 0 percent impaired, limited or restricted      Problem List Patient Active Problem List   Diagnosis Date Noted  . Medial meniscus, posterior horn derangement   . Rectal bleeding 08/29/2015  . Encounter for screening colonoscopy 08/29/2015   PHYSICAL THERAPY DISCHARGE SUMMARY  Visits from Start of Care: 7  Current functional level related to goals / functional outcomes: Patient reports he is doing extremely well and has returned to all baseline/PLOF activities, no concerns besides some occasional soreness in his knee. Upon examination patient is at very high level of function, no remaining functional concerns from PT perspective. DC today.   Remaining deficits: Occasional R knee soreness    Education / Equipment: Discussed final version of HEP, regular aerobic activity recommendations; [employer] and walking club  Plan: Patient agrees to discharge.  Patient goals were met. Patient is being discharged due to meeting the stated rehab goals.   ?????      Deniece Ree PT, DPT [facility] [address] [place], [place], [address] Phone: [phone]   Fax:  [phone]  Name: Ruben Lee MRN: [id number] Date of Birth: 1950/08/27

## 2016-04-13 ENCOUNTER — Ambulatory Visit (INDEPENDENT_AMBULATORY_CARE_PROVIDER_SITE_OTHER): Payer: PPO | Admitting: Orthopedic Surgery

## 2016-04-13 ENCOUNTER — Encounter: Payer: Self-pay | Admitting: Orthopedic Surgery

## 2016-04-13 ENCOUNTER — Encounter (HOSPITAL_COMMUNITY): Payer: Commercial Managed Care - HMO

## 2016-04-13 VITALS — BP 175/96 | HR 73 | Ht 75.0 in | Wt 213.0 lb

## 2016-04-13 DIAGNOSIS — Z4789 Encounter for other orthopedic aftercare: Secondary | ICD-10-CM

## 2016-04-13 DIAGNOSIS — Z9889 Other specified postprocedural states: Secondary | ICD-10-CM

## 2016-04-13 NOTE — Progress Notes (Signed)
Patient ID: Ruben Lee, male   DOB: 1950-09-08, 65 y.o.   MRN: [id number]  Post op visit   Chief Complaint  Patient presents with  . Follow-up    POST OP SARK, 03/11/16    BP (!) 175/96   Pulse 73   Ht 6\' 3"  (1.905 m)   Wt 213 lb (96.6 kg)   BMI 26.62 kg/m     SARK, DOS 03/11/16    PRE-OPERATIVE DIAGNOSIS:  RIGHT MEDIAL MENISCUS tear   POST-OPERATIVE DIAGNOSIS:  RIGHT MEDIAL MENISCUS tear   PROCEDURE:  Procedure(s): KNEE ARTHROSCOPY WITH MEDIAL MENISECTOMY (Right)     Findings torn posterior horn medial meniscus complex tear with horizontal cleavage components radial components.   Resected approximately 50% of the posterior horn   Mild chondral degeneration of the medial femoral condyle.   Remaining compartments relatively normal.  The patient regained full range of motion  Walking 2 miles a day  ASSESSMENT AND PLAN   Discharged to follow-up as needed

## 2016-04-16 ENCOUNTER — Encounter (HOSPITAL_COMMUNITY): Payer: Commercial Managed Care - HMO | Admitting: Physical Therapy

## 2016-04-21 ENCOUNTER — Encounter (HOSPITAL_COMMUNITY): Payer: Commercial Managed Care - HMO | Admitting: Physical Therapy

## 2016-04-23 ENCOUNTER — Encounter (HOSPITAL_COMMUNITY): Payer: Commercial Managed Care - HMO

## 2016-08-05 DIAGNOSIS — Z0000 Encounter for general adult medical examination without abnormal findings: Secondary | ICD-10-CM | POA: Diagnosis not present

## 2016-08-06 DIAGNOSIS — M25562 Pain in left knee: Secondary | ICD-10-CM | POA: Diagnosis not present

## 2016-08-26 ENCOUNTER — Encounter: Payer: Self-pay | Admitting: Orthopedic Surgery

## 2016-08-26 ENCOUNTER — Ambulatory Visit (INDEPENDENT_AMBULATORY_CARE_PROVIDER_SITE_OTHER): Payer: PPO | Admitting: Orthopedic Surgery

## 2016-08-26 ENCOUNTER — Ambulatory Visit (INDEPENDENT_AMBULATORY_CARE_PROVIDER_SITE_OTHER): Payer: PPO

## 2016-08-26 VITALS — BP 176/104 | HR 79 | Ht 75.0 in | Wt 216.0 lb

## 2016-08-26 DIAGNOSIS — M25562 Pain in left knee: Secondary | ICD-10-CM

## 2016-08-26 DIAGNOSIS — M8430XA Stress fracture, unspecified site, initial encounter for fracture: Secondary | ICD-10-CM | POA: Diagnosis not present

## 2016-08-26 DIAGNOSIS — M1712 Unilateral primary osteoarthritis, left knee: Secondary | ICD-10-CM

## 2016-08-26 NOTE — Progress Notes (Signed)
Patient ID: Ruben Lee, male   DOB: 1951/05/19, 66 y.o.   MRN: [id number]  Chief Complaint  Patient presents with  . Knee Pain    LEFT KNEE PAIN    HPI:66 year old male in his normal state of health walking 4 miles a day 2 in the morning 2 at night decided to try to jog on his knees which she hadn't done in several years. He noted intense pain in his left knee mild pain in his right knee which is status post arthroscopy and meniscectomy  He went to the primary care doctor he placed him on an NSAID with some hydrocodone and his pain got better before he got to the office  At the time of insult he had sharp severe constant medial knee pain left knee   ROS no shortness of breath chest pain fever or effusion   Past Medical History:  Diagnosis Date  . Hyperlipidemia   . Hypertension     PHYSICAL EXAM  BP (!) 176/104   Pulse 79   Ht 6\' 3"  (1.905 m)   Wt 216 lb (98 kg)   BMI 27.00 kg/m  GENERAL appearance reveals no gross abnormalities  MENTAL STATUS we note that the patient is awake alert and oriented to person place and time MOOD/AFFECT ARE NORMAL   GAIT reveals normal gait pattern  without a limp  EXAM OF THE Left KNEE SKIN no erythema lacerations or ecchymosis  INSPECTION no effusion, no joint line tenderness but tenderness over the medial tibial plateau ROM  flexion is 135 STABILITY  tests are normal MOTOR GRADE 5/5    examination of the  right knee   mild effusion 130 of flexion  VASC 2+ dorsalis pedis pulse normal capillary refill excellent warmth to the extremity  NEURO sensation and no pathologic reflexes  LYMPH deferred noncontributory   IMAGING STUDIES  I have independently reviewed the x-rays and I interpreted the x-rays as follows:  Mild arthritis medial compartment  Dx  Encounter Diagnoses  Name Primary?  . Left knee pain, unspecified chronicity Yes  . Stress reaction of bone   . Primary osteoarthritis of left knee     PLAN  Recommend  activity modification no jogging. He is okay to walk his 4 miles a day  10:18 AM Arther Abbott, MD 08/26/2016

## 2016-10-07 DIAGNOSIS — E782 Mixed hyperlipidemia: Secondary | ICD-10-CM | POA: Diagnosis not present

## 2016-10-07 DIAGNOSIS — E119 Type 2 diabetes mellitus without complications: Secondary | ICD-10-CM | POA: Diagnosis not present

## 2016-10-09 ENCOUNTER — Other Ambulatory Visit (HOSPITAL_COMMUNITY): Payer: Self-pay | Admitting: Internal Medicine

## 2016-10-09 DIAGNOSIS — R490 Dysphonia: Secondary | ICD-10-CM | POA: Diagnosis not present

## 2016-10-09 DIAGNOSIS — E875 Hyperkalemia: Secondary | ICD-10-CM | POA: Diagnosis not present

## 2016-10-09 DIAGNOSIS — I10 Essential (primary) hypertension: Secondary | ICD-10-CM | POA: Diagnosis not present

## 2016-10-09 DIAGNOSIS — E782 Mixed hyperlipidemia: Secondary | ICD-10-CM | POA: Diagnosis not present

## 2016-10-09 DIAGNOSIS — Z6827 Body mass index (BMI) 27.0-27.9, adult: Secondary | ICD-10-CM | POA: Diagnosis not present

## 2016-10-09 DIAGNOSIS — E042 Nontoxic multinodular goiter: Secondary | ICD-10-CM

## 2016-10-09 DIAGNOSIS — E119 Type 2 diabetes mellitus without complications: Secondary | ICD-10-CM | POA: Diagnosis not present

## 2016-10-09 DIAGNOSIS — E041 Nontoxic single thyroid nodule: Secondary | ICD-10-CM | POA: Diagnosis not present

## 2016-10-15 ENCOUNTER — Ambulatory Visit (HOSPITAL_COMMUNITY)
Admission: RE | Admit: 2016-10-15 | Discharge: 2016-10-15 | Disposition: A | Discharge status: Home / Self Care | Payer: PPO | Source: Ambulatory Visit | Attending: Internal Medicine | Admitting: Internal Medicine

## 2016-10-15 DIAGNOSIS — E042 Nontoxic multinodular goiter: Secondary | ICD-10-CM

## 2016-12-22 IMAGING — US US EXTREM LOW VENOUS*R*
1 series · 13 of 24 positions shown · non-contrast
Comparison: MRI of the knee 02/06/2016

CLINICAL DATA: Right knee pain and swelling. Patient is 2 days post
right knee arthroscopic.



[Series 1: us extrem low venous*right* · 0.08mm/px · 41 acquisitions, 13 frames shown]
[im 1/41]
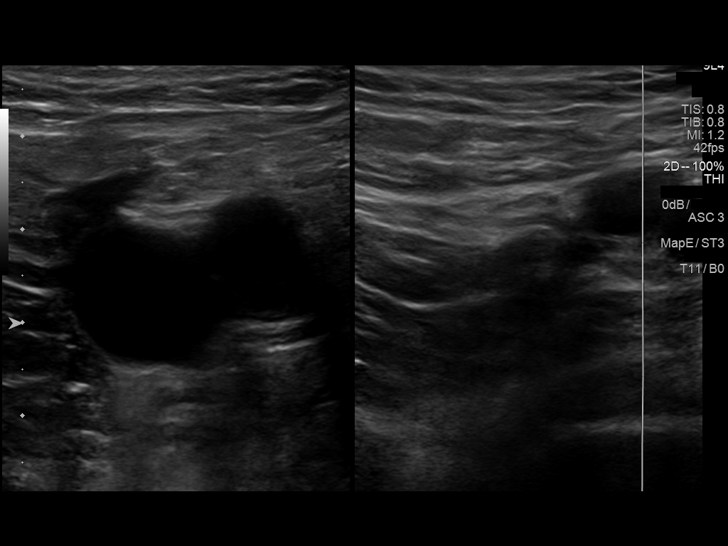
[im 4/41]
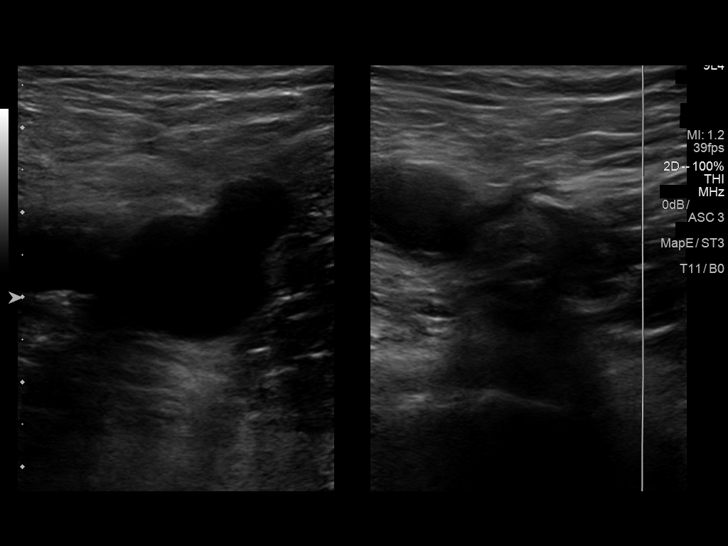
[im 7/41]
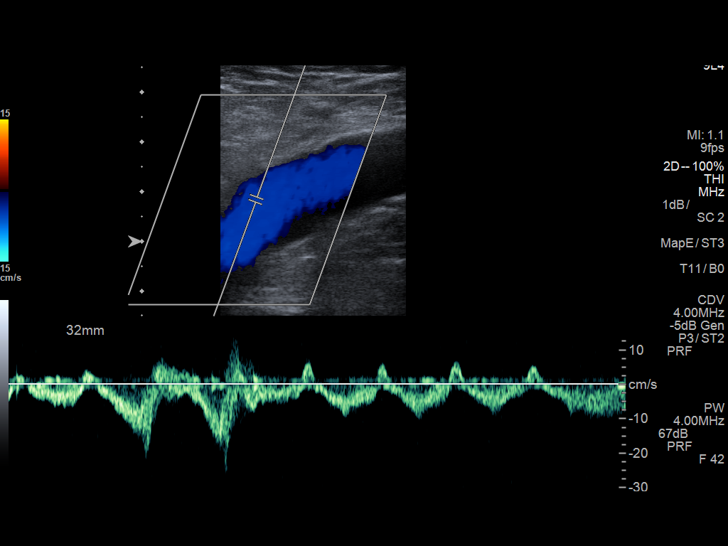
[im 11/41]
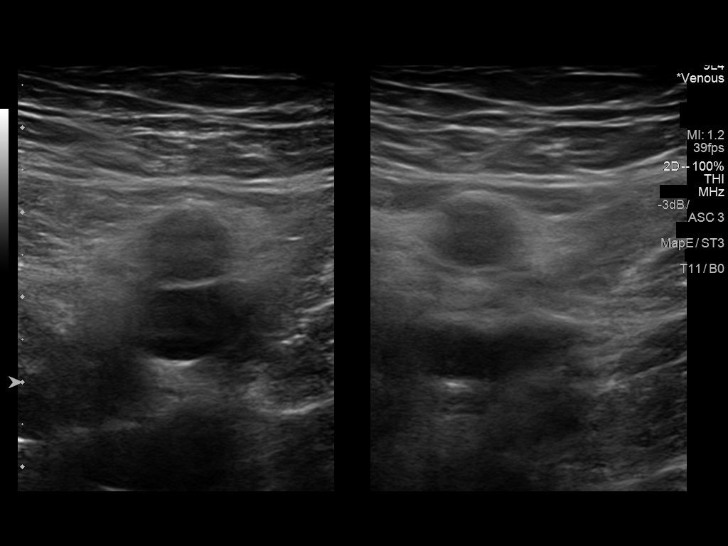
[im 14/41]
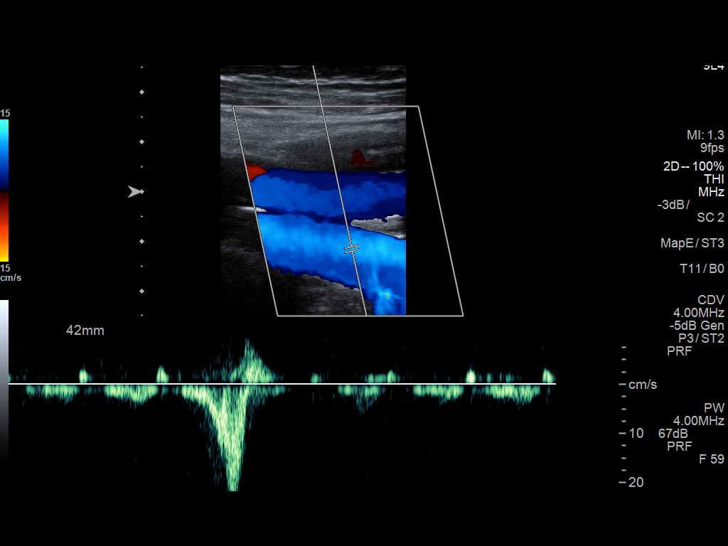
[im 18/41]
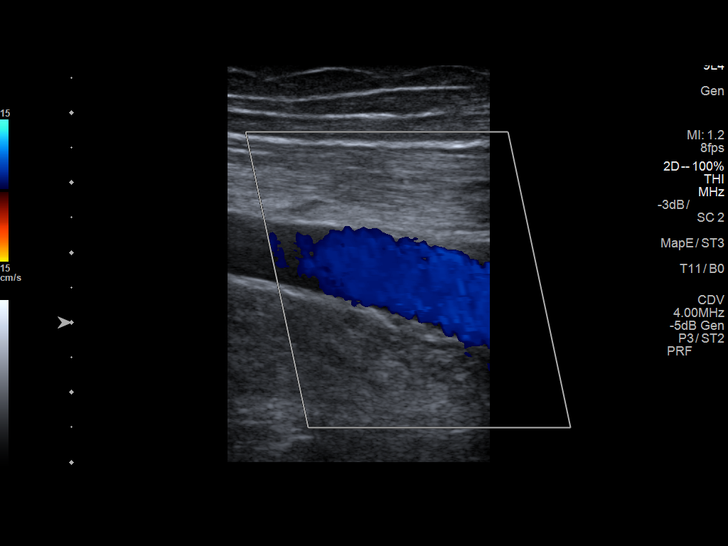
[im 23/41]
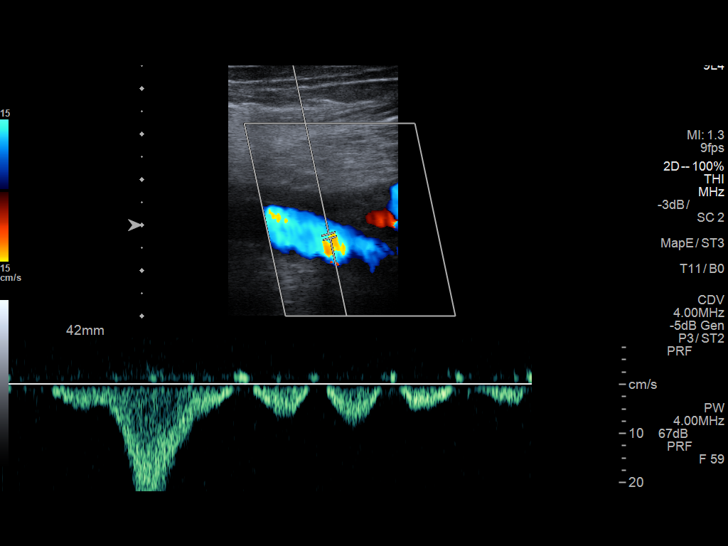
[im 25/41]
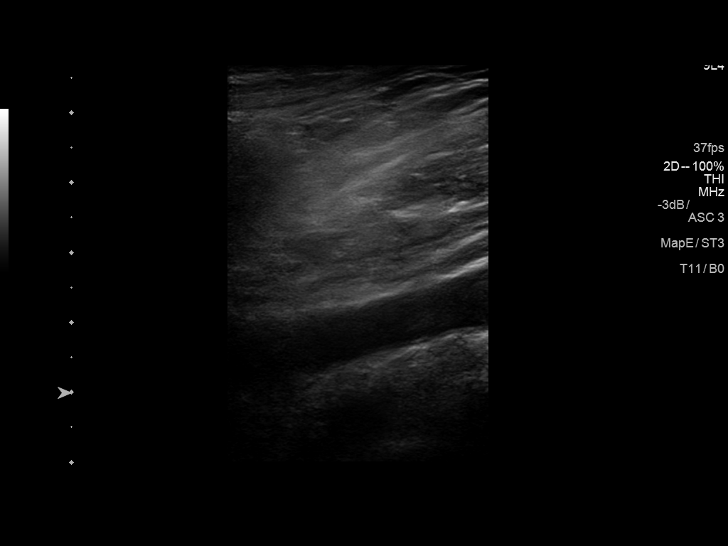
[im 28/41]
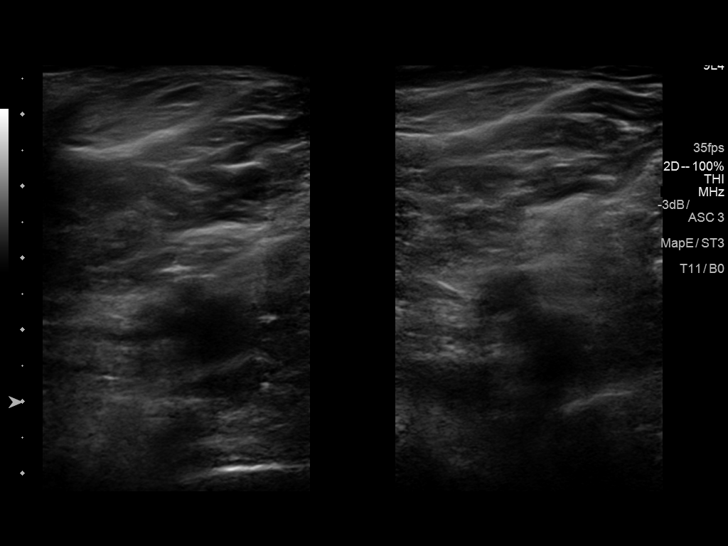
[im 32/41]
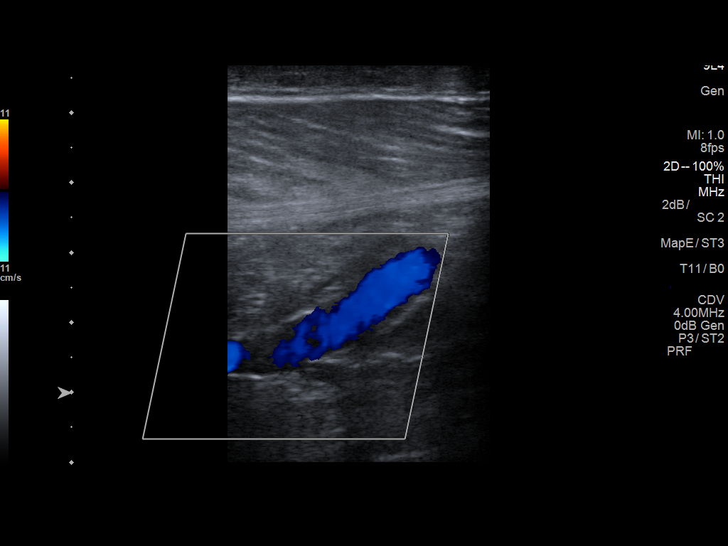
[im 35/41]
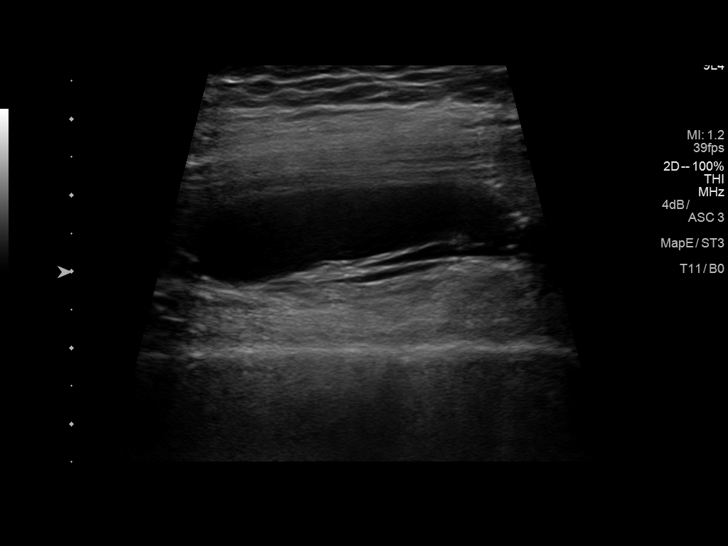
[im 39/41]
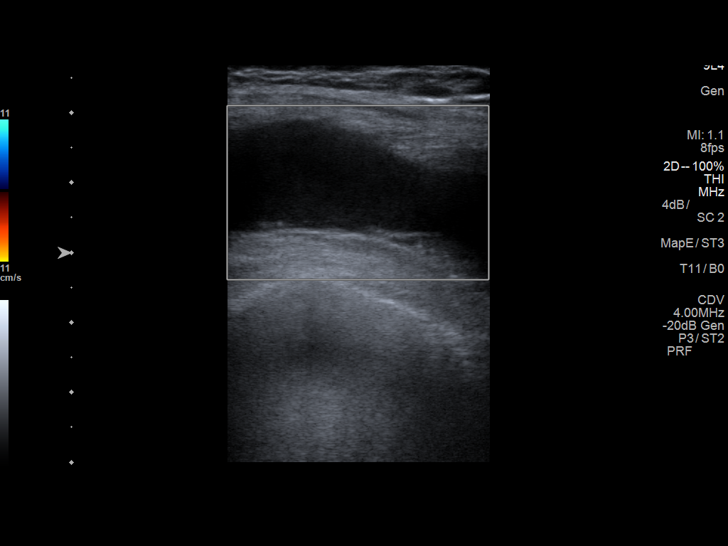
[im 41/41]
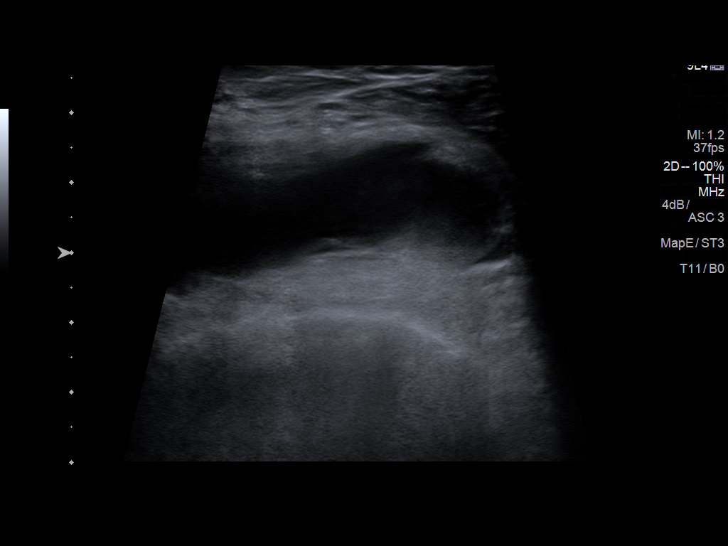

[13 of 24 positions shown; findings below may reference images not displayed]

FINDINGS: Contralateral Common Femoral Vein: Respiratory phasicity is normal
and symmetric with the symptomatic side. No evidence of thrombus.
Normal compressibility.

Common Femoral Vein: No evidence of thrombus. Normal
compressibility, respiratory phasicity and response to augmentation.

Saphenofemoral Junction: No evidence of thrombus. Normal
compressibility and flow on color Doppler imaging.

Profunda Femoral Vein: No evidence of thrombus. Normal
compressibility and flow on color Doppler imaging.

Femoral Vein: No evidence of thrombus. Normal compressibility,
respiratory phasicity and response to augmentation.

Popliteal Vein: No evidence of thrombus. Normal compressibility,
respiratory phasicity and response to augmentation.

Calf Veins: No evidence of thrombus. Normal compressibility and flow
on color Doppler imaging.

Superficial Great Saphenous Vein: No evidence of thrombus. Normal
compressibility and flow on color Doppler imaging.

Venous Reflux:  None.

Other Findings: There is a complex fluid collection superior to the
patella, which measures 5.5 by 6.1 cm.
IMPRESSION: No evidence of deep venous thrombosis.

6 x 1 cm complex fluid collection superior to the patella which may
represent suprapatellar joint effusion versus intramuscular
hematoma/seroma.

## 2017-02-04 DIAGNOSIS — E119 Type 2 diabetes mellitus without complications: Secondary | ICD-10-CM | POA: Diagnosis not present

## 2017-02-05 DIAGNOSIS — I10 Essential (primary) hypertension: Secondary | ICD-10-CM | POA: Diagnosis not present

## 2017-02-05 DIAGNOSIS — E875 Hyperkalemia: Secondary | ICD-10-CM | POA: Diagnosis not present

## 2017-02-05 DIAGNOSIS — Z6827 Body mass index (BMI) 27.0-27.9, adult: Secondary | ICD-10-CM | POA: Diagnosis not present

## 2017-02-05 DIAGNOSIS — E041 Nontoxic single thyroid nodule: Secondary | ICD-10-CM | POA: Diagnosis not present

## 2017-02-05 DIAGNOSIS — E782 Mixed hyperlipidemia: Secondary | ICD-10-CM | POA: Diagnosis not present

## 2017-02-05 DIAGNOSIS — E119 Type 2 diabetes mellitus without complications: Secondary | ICD-10-CM | POA: Diagnosis not present

## 2017-02-05 DIAGNOSIS — R490 Dysphonia: Secondary | ICD-10-CM | POA: Diagnosis not present

## 2017-02-12 ENCOUNTER — Other Ambulatory Visit (HOSPITAL_COMMUNITY): Payer: Self-pay | Admitting: Internal Medicine

## 2017-02-12 DIAGNOSIS — E041 Nontoxic single thyroid nodule: Secondary | ICD-10-CM

## 2017-02-18 ENCOUNTER — Encounter (HOSPITAL_COMMUNITY): Payer: Self-pay

## 2017-02-18 ENCOUNTER — Ambulatory Visit (HOSPITAL_COMMUNITY)
Admission: RE | Admit: 2017-02-18 | Discharge: 2017-02-18 | Disposition: A | Discharge status: Home / Self Care | Payer: PPO | Source: Ambulatory Visit | Attending: Internal Medicine | Admitting: Internal Medicine

## 2017-02-18 DIAGNOSIS — E041 Nontoxic single thyroid nodule: Secondary | ICD-10-CM

## 2017-02-18 MED ORDER — LIDOCAINE HCL (PF) 2 % IJ SOLN
INTRAMUSCULAR | Status: AC
  Filled 2017-02-18: qty 10

## 2017-02-18 NOTE — Discharge Instructions (Signed)
Thyroid Biopsy °The thyroid gland is a butterfly-shaped gland located in the front of the neck. It produces hormones that affect metabolism, growth and development, and body temperature. Thyroid biopsy is a procedure in which small samples of tissue or fluid are removed from the thyroid gland. The samples are then looked at under a microscope to check for abnormalities. This procedure is done to determine the cause of thyroid problems. It may be done to check for infection, cancer, or other thyroid problems. °Two methods may be used for a thyroid biopsy. In one method, a thin needle is inserted through the skin and into the thyroid gland. In the other method, an open incision is made through the skin. °Tell a health care provider about: °· Any allergies you have. °· All medicines you are taking, including vitamins, herbs, eye drops, creams, and over-the-counter medicines. °· Any problems you or family members have had with anesthetic medicines. °· Any blood disorders you have. °· Any surgeries you have had. °· Any medical conditions you have. °What are the risks? °Generally, this is a safe procedure. However, problems can occur and include: °· Bleeding from the procedure site. °· Infection. °· Injury to structures near the thyroid gland. ° °What happens before the procedure? °· Ask your health care provider about: °? Changing or stopping your regular medicines. This is especially important if you are taking diabetes medicines or blood thinners. °? Taking medicines such as aspirin and ibuprofen. These medicines can thin your blood. Do not take these medicines before your procedure if your health care provider asks you not to. °· Do not eat or drink anything after midnight on the night before the procedure or as directed by your health care provider. °· You may have a blood sample taken. °What happens during the procedure? °Either of these methods may be used to perform a thyroid biopsy: °· Fine needle biopsy. You may  be given medicine to help you relax (sedative). You will be asked to lie on your back with your head tipped backward to extend your neck. An area on your neck will be cleaned. A needle will then be inserted through the skin of your neck. You may be asked to avoid coughing, talking, swallowing, or making sounds during some portions of the procedure. The needle will be withdrawn once the tissue or fluid samples have been removed. Pressure may be applied to your neck to reduce swelling and ensure that bleeding has stopped. The samples will be sent to a lab for examination. °· Open biopsy. You will be given medicine to make you sleep (general anesthetic). An incision will be made in your neck. A sample of thyroid tissue will be removed using surgical tools. The tissue sample will be sent for examination. In some cases, the sample may be examined during the biopsy. If that is done and cancer cells are found, some or all of the thyroid gland may be removed. The incision will be closed with stitches. ° °What happens after the procedure? °· Your recovery will be assessed and monitored. °· You may have soreness and tenderness at the site of the biopsy. This should go away after a few days. °· If you had an open biopsy, you may have a hoarse voice or sore throat for a couple days. °· It is your responsibility to get your test results. °This information is not intended to replace advice given to you by your health care provider. Make sure you discuss any questions you have with   your health care provider. °Document Released: 05/31/2007 Document Revised: 04/05/2016 Document Reviewed: 10/26/2013 °Elsevier Interactive Patient Education © 2018 [employer] ° °

## 2017-06-07 DIAGNOSIS — E119 Type 2 diabetes mellitus without complications: Secondary | ICD-10-CM | POA: Diagnosis not present

## 2017-06-07 DIAGNOSIS — I10 Essential (primary) hypertension: Secondary | ICD-10-CM | POA: Diagnosis not present

## 2017-06-09 DIAGNOSIS — D126 Benign neoplasm of colon, unspecified: Secondary | ICD-10-CM | POA: Diagnosis not present

## 2017-06-09 DIAGNOSIS — I10 Essential (primary) hypertension: Secondary | ICD-10-CM | POA: Diagnosis not present

## 2017-06-09 DIAGNOSIS — E119 Type 2 diabetes mellitus without complications: Secondary | ICD-10-CM | POA: Diagnosis not present

## 2017-06-09 DIAGNOSIS — E785 Hyperlipidemia, unspecified: Secondary | ICD-10-CM | POA: Diagnosis not present

## 2017-06-09 DIAGNOSIS — Z8639 Personal history of other endocrine, nutritional and metabolic disease: Secondary | ICD-10-CM | POA: Diagnosis not present

## 2017-06-09 DIAGNOSIS — E875 Hyperkalemia: Secondary | ICD-10-CM | POA: Diagnosis not present

## 2017-09-27 DIAGNOSIS — E782 Mixed hyperlipidemia: Secondary | ICD-10-CM | POA: Diagnosis not present

## 2017-09-27 DIAGNOSIS — R7301 Impaired fasting glucose: Secondary | ICD-10-CM | POA: Diagnosis not present

## 2017-09-27 DIAGNOSIS — E119 Type 2 diabetes mellitus without complications: Secondary | ICD-10-CM | POA: Diagnosis not present

## 2017-09-27 DIAGNOSIS — I10 Essential (primary) hypertension: Secondary | ICD-10-CM | POA: Diagnosis not present

## 2017-09-29 DIAGNOSIS — E782 Mixed hyperlipidemia: Secondary | ICD-10-CM | POA: Diagnosis not present

## 2017-09-29 DIAGNOSIS — E119 Type 2 diabetes mellitus without complications: Secondary | ICD-10-CM | POA: Diagnosis not present

## 2017-09-29 DIAGNOSIS — E875 Hyperkalemia: Secondary | ICD-10-CM | POA: Diagnosis not present

## 2017-09-29 DIAGNOSIS — Z8639 Personal history of other endocrine, nutritional and metabolic disease: Secondary | ICD-10-CM | POA: Diagnosis not present

## 2017-09-29 DIAGNOSIS — Z6826 Body mass index (BMI) 26.0-26.9, adult: Secondary | ICD-10-CM | POA: Diagnosis not present

## 2017-09-29 DIAGNOSIS — I10 Essential (primary) hypertension: Secondary | ICD-10-CM | POA: Diagnosis not present

## 2017-11-29 IMAGING — US US THYROID BIOPSY
1 series · 3 of 3 positions shown · non-contrast
Comparison: Thyroid ultrasound 10/15/2016.

MEDICATIONS:
None .

COMPLICATIONS:
None immediate.

INDICATION: Indeterminate thyroid nodule

EXAM:
ULTRASOUND GUIDED FINE NEEDLE ASPIRATION OF INDETERMINATE THYROID
NODULE
CLINICAL DATA: Thyroid nodule.
TECHNIQUE: Informed written consent was obtained from the patient after a
discussion of the risks, benefits and alternatives to treatment.
Questions regarding the procedure were encouraged and answered. A
timeout was performed prior to the initiation of the procedure.

[Series 1: us thyroid biopsy · 0.07mm/px · 3 acquisitions, 3 frames shown]
[im 1/3]
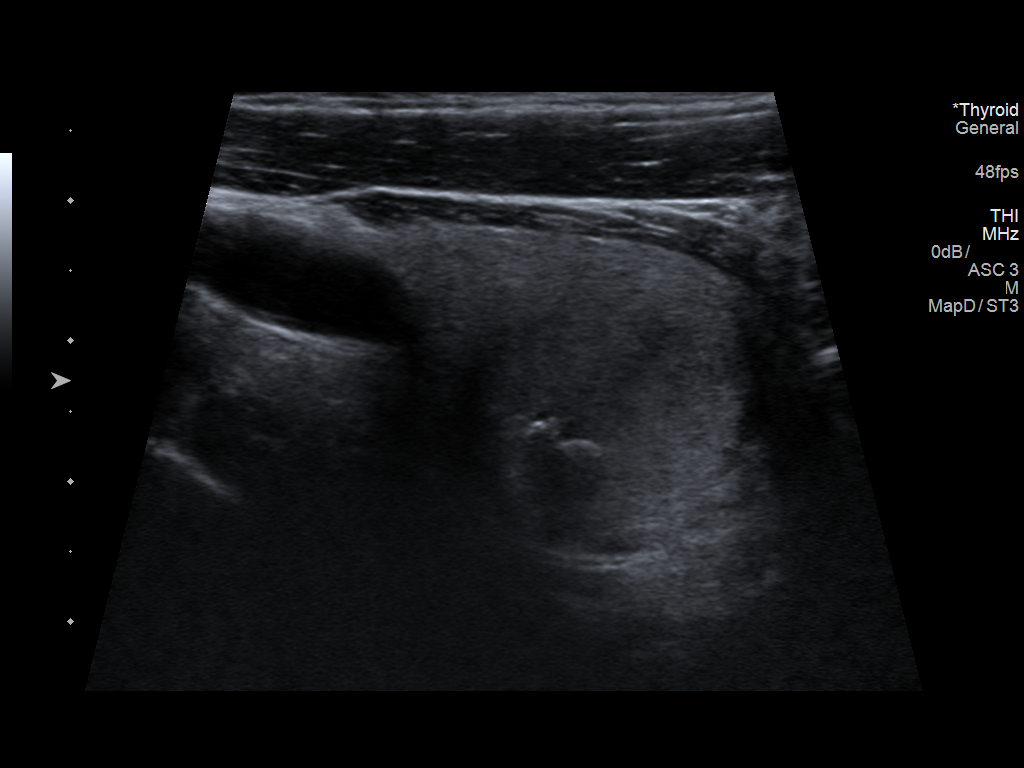
[im 2/3]
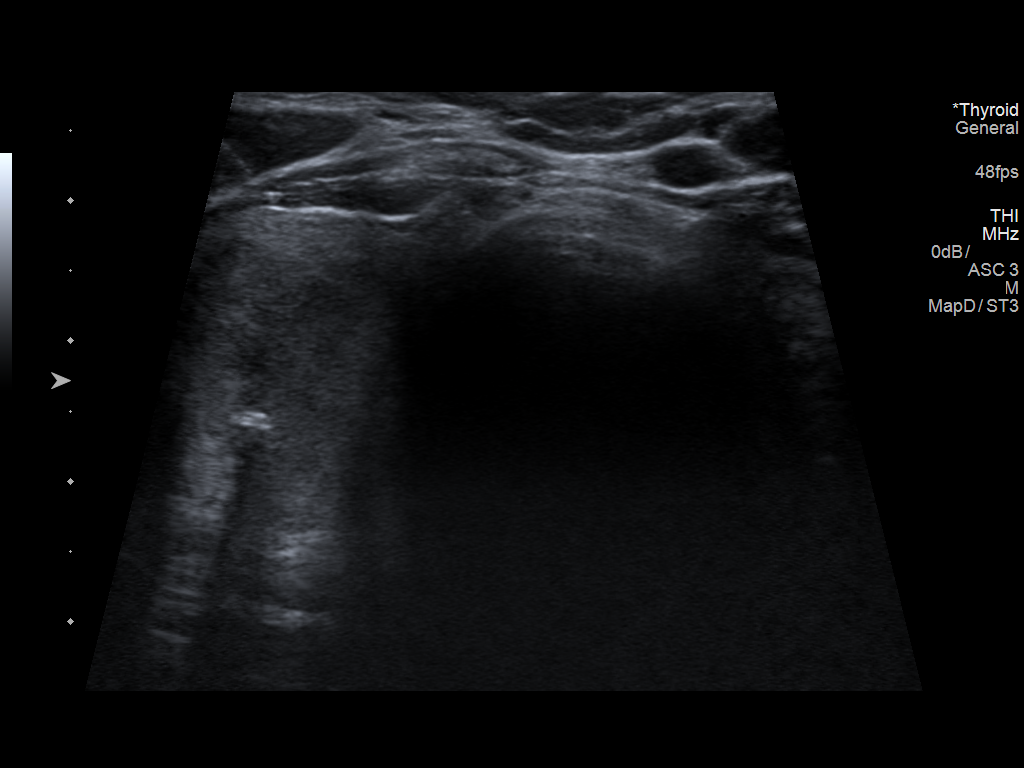
[im 3/3]
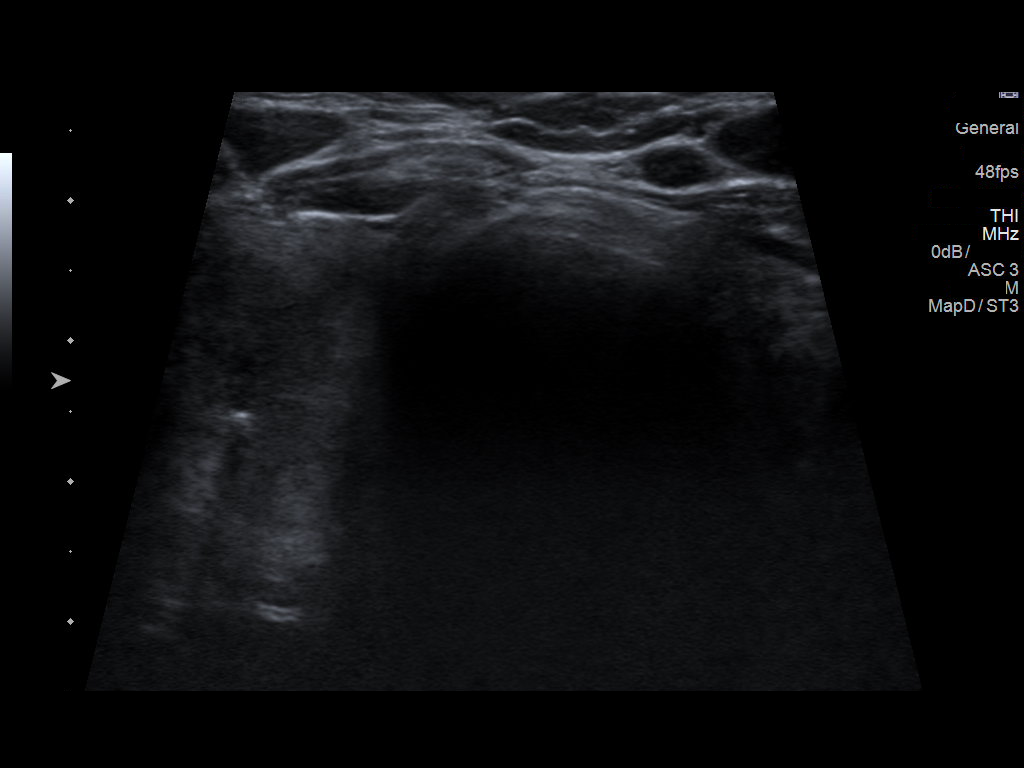

[3 of 3 positions shown; findings below may reference images not displayed]

Pre-procedural ultrasound scanning demonstrated unchanged size and
appearance of the indeterminate nodule within the right lobe

The procedure was planned. The neck was prepped in the usual sterile
fashion, and a sterile drape was applied covering the operative
field. A timeout was performed prior to the initiation of the
procedure. Local anesthesia was provided with 1% lidocaine.

Under direct ultrasound guidance, multiple FNA biopsies were
performed of the right lobe nodule with a 25 gauge needle. Multiple
ultrasound images were saved for procedural documentation purposes.
The samples were prepared and submitted to pathology.

Limited post procedural scanning was negative for hematoma or
additional complication. Dressings were placed. The patient
tolerated the above procedures procedure well without immediate
postprocedural complication.
FINDINGS: Nodule reference number based on prior diagnostic ultrasound: 1.

Maximum size: 0 2 cm

Location: Right; inferior

ACR TI-RADS risk category: 4

Reason for biopsy: Large solid nodule, rule out malignancy.

Ultrasound imaging confirms appropriate placement of the needles
within the thyroid nodule.
IMPRESSION: Technically successful ultrasound guided fine needle aspiration of
right thyroid lobe nodule.

## 2018-01-24 DIAGNOSIS — E119 Type 2 diabetes mellitus without complications: Secondary | ICD-10-CM | POA: Diagnosis not present

## 2018-01-24 DIAGNOSIS — E782 Mixed hyperlipidemia: Secondary | ICD-10-CM | POA: Diagnosis not present

## 2018-01-26 DIAGNOSIS — E1169 Type 2 diabetes mellitus with other specified complication: Secondary | ICD-10-CM | POA: Diagnosis not present

## 2018-01-26 DIAGNOSIS — E782 Mixed hyperlipidemia: Secondary | ICD-10-CM | POA: Diagnosis not present

## 2018-01-26 DIAGNOSIS — E041 Nontoxic single thyroid nodule: Secondary | ICD-10-CM | POA: Diagnosis not present

## 2018-01-26 DIAGNOSIS — I10 Essential (primary) hypertension: Secondary | ICD-10-CM | POA: Diagnosis not present

## 2018-01-26 DIAGNOSIS — B070 Plantar wart: Secondary | ICD-10-CM | POA: Diagnosis not present

## 2018-01-26 DIAGNOSIS — E875 Hyperkalemia: Secondary | ICD-10-CM | POA: Diagnosis not present

## 2018-03-03 DIAGNOSIS — E875 Hyperkalemia: Secondary | ICD-10-CM | POA: Diagnosis not present

## 2018-03-03 DIAGNOSIS — E782 Mixed hyperlipidemia: Secondary | ICD-10-CM | POA: Diagnosis not present

## 2018-03-03 DIAGNOSIS — E041 Nontoxic single thyroid nodule: Secondary | ICD-10-CM | POA: Diagnosis not present

## 2018-03-03 DIAGNOSIS — L501 Idiopathic urticaria: Secondary | ICD-10-CM | POA: Diagnosis not present

## 2018-03-03 DIAGNOSIS — I10 Essential (primary) hypertension: Secondary | ICD-10-CM | POA: Diagnosis not present

## 2018-03-03 DIAGNOSIS — E1169 Type 2 diabetes mellitus with other specified complication: Secondary | ICD-10-CM | POA: Diagnosis not present

## 2018-03-03 DIAGNOSIS — E119 Type 2 diabetes mellitus without complications: Secondary | ICD-10-CM | POA: Diagnosis not present

## 2018-06-10 DIAGNOSIS — E119 Type 2 diabetes mellitus without complications: Secondary | ICD-10-CM | POA: Diagnosis not present

## 2018-06-10 DIAGNOSIS — I10 Essential (primary) hypertension: Secondary | ICD-10-CM | POA: Diagnosis not present

## 2018-06-30 DIAGNOSIS — E1169 Type 2 diabetes mellitus with other specified complication: Secondary | ICD-10-CM | POA: Diagnosis not present

## 2018-06-30 DIAGNOSIS — I10 Essential (primary) hypertension: Secondary | ICD-10-CM | POA: Diagnosis not present

## 2018-06-30 DIAGNOSIS — R7301 Impaired fasting glucose: Secondary | ICD-10-CM | POA: Diagnosis not present

## 2018-06-30 DIAGNOSIS — E782 Mixed hyperlipidemia: Secondary | ICD-10-CM | POA: Diagnosis not present

## 2018-07-05 DIAGNOSIS — E782 Mixed hyperlipidemia: Secondary | ICD-10-CM | POA: Diagnosis not present

## 2018-07-05 DIAGNOSIS — E119 Type 2 diabetes mellitus without complications: Secondary | ICD-10-CM | POA: Diagnosis not present

## 2018-07-05 DIAGNOSIS — E875 Hyperkalemia: Secondary | ICD-10-CM | POA: Diagnosis not present

## 2018-07-05 DIAGNOSIS — Z0000 Encounter for general adult medical examination without abnormal findings: Secondary | ICD-10-CM | POA: Diagnosis not present

## 2018-07-05 DIAGNOSIS — E1140 Type 2 diabetes mellitus with diabetic neuropathy, unspecified: Secondary | ICD-10-CM | POA: Diagnosis not present

## 2018-07-05 DIAGNOSIS — R351 Nocturia: Secondary | ICD-10-CM | POA: Diagnosis not present

## 2018-07-05 DIAGNOSIS — I10 Essential (primary) hypertension: Secondary | ICD-10-CM | POA: Diagnosis not present

## 2018-07-05 DIAGNOSIS — G9009 Other idiopathic peripheral autonomic neuropathy: Secondary | ICD-10-CM | POA: Diagnosis not present

## 2018-07-26 DIAGNOSIS — M79673 Pain in unspecified foot: Secondary | ICD-10-CM | POA: Diagnosis not present

## 2018-07-26 DIAGNOSIS — I10 Essential (primary) hypertension: Secondary | ICD-10-CM | POA: Diagnosis not present

## 2018-08-09 ENCOUNTER — Encounter: Payer: Self-pay | Admitting: Gastroenterology

## 2018-08-19 DIAGNOSIS — E119 Type 2 diabetes mellitus without complications: Secondary | ICD-10-CM | POA: Diagnosis not present

## 2018-08-19 DIAGNOSIS — I10 Essential (primary) hypertension: Secondary | ICD-10-CM | POA: Diagnosis not present

## 2018-08-19 DIAGNOSIS — E782 Mixed hyperlipidemia: Secondary | ICD-10-CM | POA: Diagnosis not present

## 2018-08-24 ENCOUNTER — Telehealth: Payer: Self-pay | Admitting: Gastroenterology

## 2018-08-24 NOTE — Telephone Encounter (Signed)
Please advise if patient needs OV or NV. [phone]

## 2018-08-24 NOTE — Telephone Encounter (Signed)
done

## 2018-08-24 NOTE — Telephone Encounter (Signed)
Please schedule nurse visit

## 2018-09-25 DIAGNOSIS — E782 Mixed hyperlipidemia: Secondary | ICD-10-CM | POA: Diagnosis not present

## 2018-09-25 DIAGNOSIS — I10 Essential (primary) hypertension: Secondary | ICD-10-CM | POA: Diagnosis not present

## 2018-09-25 DIAGNOSIS — E119 Type 2 diabetes mellitus without complications: Secondary | ICD-10-CM | POA: Diagnosis not present

## 2018-09-26 ENCOUNTER — Ambulatory Visit (INDEPENDENT_AMBULATORY_CARE_PROVIDER_SITE_OTHER): Payer: Self-pay

## 2018-09-26 DIAGNOSIS — Z86010 Personal history of colonic polyps: Secondary | ICD-10-CM

## 2018-09-26 MED ORDER — NA SULFATE-K SULFATE-MG SULF 17.5-3.13-1.6 GM/177ML PO SOLN: 1.0000 | ORAL | 0 refills | Status: DC

## 2018-09-26 NOTE — Patient Instructions (Addendum)
Ruben Lee  [birth year] MRN: [id number]     Procedure Date: 02/10/2019 Time to register: 1:00pm Place to register: [facility] Procedure Time: 2:00pm Scheduled provider: Barney Drain, MD    PREPARATION FOR COLONOSCOPY WITH SUPREP BOWEL PREP KIT  Note: Suprep Bowel Prep Kit is a split-dose (2day) regimen. Consumption of BOTH 6-ounce bottles is required for a complete prep.  Please notify us immediately if you are diabetic, take iron supplements, or if you are on Coumadin or any other blood thinners.                                                                                                                                                 1 DAY BEFORE PROCEDURE:  DATE: 02/09/2019   DAY: Thursday Continue clear liquids the entire day - NO SOLID FOOD.   At 6:00pm: Complete steps 1 through 4 below, using ONE (1) 6-ounce bottle, before going to bed. Step 1:  Pour ONE (1) 6-ounce bottle of SUPREP liquid into the mixing container.  Step 2:  Add cool drinking water to the 16 ounce line on the container and mix.  Note: Dilute the solution concentrate as directed prior to use. Step 3:  DRINK ALL the liquid in the container. Step 4:  You MUST drink an additional two (2) or more 16 ounce containers of water over the next one (1) hour.   Continue clear liquids.  DAY OF PROCEDURE:   DATE: 02/10/2019  DAY: Friday If you take medications for your heart, blood pressure, or breathing, you may take these medications.    5 hours before your procedure at :9:00am Step 1:  Pour ONE (1) 6-ounce bottle of SUPREP liquid into the mixing container.  Step 2:  Add cool drinking water to the 16 ounce line on the container and mix.  Note: Dilute the solution concentrate as directed prior to use. Step 3:  DRINK ALL the liquid in the container. Step 4:  You MUST drink an additional two (2) or more 16 ounce containers of water over the next one (1) hour. You MUST complete the final glass of water at  least 3 hours before your colonoscopy.   Nothing by mouth past 11:00am  You may take your morning medications with sip of water unless we have instructed otherwise.    Please see below for Dietary Information.  CLEAR LIQUIDS INCLUDE:  Water Jello (NOT red in color)   Ice Popsicles (NOT red in color)   Tea (sugar ok, no milk/cream) Powdered fruit flavored drinks  Coffee (sugar ok, no milk/cream) Gatorade/ Lemonade/ Kool-Aid  (NOT red in color)   Juice: apple, white grape, white cranberry Soft drinks  Clear bullion, consomme, broth (fat free beef/chicken/vegetable)  Carbonated beverages (any kind)  Strained chicken noodle soup Hard Candy   Remember: Clear liquids are liquids that will  allow you to see your fingers on the other side of a clear glass. Be sure liquids are NOT red in color, and not cloudy, but CLEAR.  DO NOT EAT OR DRINK ANY OF THE FOLLOWING:  Dairy products of any kind   Cranberry juice Tomato juice / V8 juice   Grapefruit juice Orange juice     Red grape juice  Do not eat any solid foods, including such foods as: cereal, oatmeal, yogurt, fruits, vegetables, creamed soups, eggs, bread, crackers, pureed foods in a blender, etc.   HELPFUL HINTS FOR DRINKING PREP SOLUTION:   Make sure prep is extremely cold. Mix and refrigerate the the morning of the prep. You may also put in the freezer.   You may try mixing some Crystal Light or Country Time Lemonade if you prefer. Mix in small amounts; add more if necessary.  Try drinking through a straw  Rinse mouth with water or a mouthwash between glasses, to remove after-taste.  Try sipping on a cold beverage /ice/ popsicles between glasses of prep.  Place a piece of sugar-free hard candy in mouth between glasses.  If you become nauseated, try consuming smaller amounts, or stretch out the time between glasses. Stop for 30-60 minutes, then slowly start back drinking.     OTHER INSTRUCTIONS  You will need a responsible  adult at least 68 years of age to accompany you and drive you home. This person must remain in the waiting room during your procedure. The hospital will cancel your procedure if you do not have a responsible adult with you.   1. Wear loose fitting clothing that is easily removed. 2. Leave jewelry and other valuables at home.  3. Remove all body piercing jewelry and leave at home. 4. Total time from sign-in until discharge is approximately 2-3 hours. 5. You should go home directly after your procedure and rest. You can resume normal activities the day after your procedure. 6. The day of your procedure you should not:  Drive  Make legal decisions  Operate machinery  Drink alcohol  Return to work   You may call the office (Dept: [phone]) before 5:00pm, or page the doctor on call [phone]) after 5:00pm, for further instructions, if necessary.   Insurance Information YOU WILL NEED TO CHECK WITH YOUR INSURANCE COMPANY FOR THE BENEFITS OF COVERAGE YOU HAVE FOR THIS PROCEDURE.  UNFORTUNATELY, NOT ALL INSURANCE COMPANIES HAVE BENEFITS TO COVER ALL OR PART OF THESE TYPES OF PROCEDURES.  IT IS YOUR RESPONSIBILITY TO CHECK YOUR BENEFITS, HOWEVER, WE WILL BE GLAD TO ASSIST YOU WITH ANY CODES YOUR INSURANCE COMPANY MAY NEED.    PLEASE NOTE THAT MOST INSURANCE COMPANIES WILL NOT COVER A SCREENING COLONOSCOPY FOR PEOPLE UNDER THE AGE OF 50  IF YOU HAVE BCBS INSURANCE, YOU MAY HAVE BENEFITS FOR A SCREENING COLONOSCOPY BUT IF POLYPS ARE FOUND THE DIAGNOSIS WILL CHANGE AND THEN YOU MAY HAVE A DEDUCTIBLE THAT WILL NEED TO BE MET. SO PLEASE MAKE SURE YOU CHECK YOUR BENEFITS FOR A SCREENING COLONOSCOPY AS WELL AS A DIAGNOSTIC COLONOSCOPY.

## 2018-09-26 NOTE — Progress Notes (Signed)
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 Diabetes Melitis: no 2. Joint replacements in the past 12 months: no 3. Major health problems in the past 3 months: no 4. Has an artificial valve or MVP: no 5. Has a defibrillator: no 6. Has been advised in past to take antibiotics in advance of a procedure like teeth cleaning: no 7. Family history of colon cancer: no  8. Alcohol Use: no 9. History of sleep apnea: no  10. History of coronary artery or other vascular stents placed within the last 12 months: no 11. History of any prior anesthesia complications: no    MEDICATIONS & ALLERGIES:    Patient reports the following regarding taking any blood thinners:   Plavix? no Aspirin? yes (81mg ) Coumadin? no Brilinta? no Xarelto? no Eliquis? no Pradaxa? no Savaysa? no Effient? no  Patient confirms/reports the following medications:  Current Outpatient Medications  Medication Sig Dispense Refill  . acetaminophen (TYLENOL) 500 MG tablet Take 1,000 mg by mouth at bedtime.    Marland Kitchen aspirin 81 MG tablet Take 81 mg by mouth daily.    . cetirizine (ZYRTEC) 10 MG tablet Take 10 mg by mouth daily.    Marland Kitchen lovastatin (MEVACOR) 10 MG tablet Take 10 mg by mouth at bedtime.   0  . Multiple Vitamin (MULTIVITAMIN) capsule Take 1 capsule by mouth daily.    Marland Kitchen olmesartan (BENICAR) 20 MG tablet daily.     No current facility-administered medications for this visit.     Patient confirms/reports the following allergies:  No Known Allergies  No orders of the defined types were placed in this encounter.   AUTHORIZATION INFORMATION Primary Insurance: [employer],  ID #: [id number] Pre-Cert / Josem Kaufmann required: no  SCHEDULE INFORMATION: Procedure has been scheduled as follows:  Date: 11/10/18, Time:  2:00 Location: [facility]  This Gastroenterology Pre-Precedure Review Form is being routed to the following provider(s): Roseanne Kaufman NP

## 2018-09-28 NOTE — Addendum Note (Signed)
Addended by: Claudina Lick on: 09/28/2018 11:41 AM   Modules accepted: Orders, SmartSet

## 2018-10-03 NOTE — Progress Notes (Signed)
Appropriate.

## 2018-10-24 DIAGNOSIS — E1169 Type 2 diabetes mellitus with other specified complication: Secondary | ICD-10-CM | POA: Diagnosis not present

## 2018-10-24 DIAGNOSIS — E782 Mixed hyperlipidemia: Secondary | ICD-10-CM | POA: Diagnosis not present

## 2018-10-24 DIAGNOSIS — I10 Essential (primary) hypertension: Secondary | ICD-10-CM | POA: Diagnosis not present

## 2018-10-24 DIAGNOSIS — E119 Type 2 diabetes mellitus without complications: Secondary | ICD-10-CM | POA: Diagnosis not present

## 2018-10-28 ENCOUNTER — Other Ambulatory Visit (HOSPITAL_COMMUNITY): Payer: Self-pay | Admitting: Internal Medicine

## 2018-10-28 DIAGNOSIS — E1140 Type 2 diabetes mellitus with diabetic neuropathy, unspecified: Secondary | ICD-10-CM | POA: Diagnosis not present

## 2018-10-28 DIAGNOSIS — I10 Essential (primary) hypertension: Secondary | ICD-10-CM | POA: Diagnosis not present

## 2018-10-28 DIAGNOSIS — E041 Nontoxic single thyroid nodule: Secondary | ICD-10-CM | POA: Diagnosis not present

## 2018-10-28 DIAGNOSIS — E782 Mixed hyperlipidemia: Secondary | ICD-10-CM | POA: Diagnosis not present

## 2018-10-28 DIAGNOSIS — I739 Peripheral vascular disease, unspecified: Secondary | ICD-10-CM

## 2018-11-01 ENCOUNTER — Ambulatory Visit (HOSPITAL_COMMUNITY)
Admission: RE | Admit: 2018-11-01 | Discharge: 2018-11-01 | Disposition: A | Discharge status: Home / Self Care | Payer: PPO | Source: Ambulatory Visit | Attending: Internal Medicine | Admitting: Internal Medicine

## 2018-11-01 ENCOUNTER — Other Ambulatory Visit: Payer: Self-pay

## 2018-11-01 DIAGNOSIS — R200 Anesthesia of skin: Secondary | ICD-10-CM | POA: Diagnosis not present

## 2018-11-01 DIAGNOSIS — M79671 Pain in right foot: Secondary | ICD-10-CM | POA: Diagnosis not present

## 2018-11-01 DIAGNOSIS — M79672 Pain in left foot: Secondary | ICD-10-CM | POA: Diagnosis not present

## 2018-11-01 DIAGNOSIS — I739 Peripheral vascular disease, unspecified: Secondary | ICD-10-CM | POA: Insufficient documentation

## 2018-11-03 ENCOUNTER — Telehealth: Payer: Self-pay | Admitting: Gastroenterology

## 2018-11-03 NOTE — Telephone Encounter (Signed)
SCHEDULED FOR SURVEILLANCE TCS. [place] FOR 2-3 MOS DUE TO COVID 19 RESTRICTIONS.

## 2018-11-03 NOTE — Telephone Encounter (Signed)
Routing to Angie 

## 2018-11-04 ENCOUNTER — Encounter: Payer: Self-pay | Admitting: *Deleted

## 2018-11-04 NOTE — Telephone Encounter (Signed)
Pt's colonoscopy re-scheduled to 02-10-2019.

## 2018-11-04 NOTE — Progress Notes (Signed)
Pt's colonoscopy re-scheduled to 02/10/2019 due to the coronavirus epidemic.  Pt aware that we are mailing out new instructions.  Endo notified.

## 2018-12-20 DIAGNOSIS — Z0000 Encounter for general adult medical examination without abnormal findings: Secondary | ICD-10-CM | POA: Diagnosis not present

## 2019-01-05 DIAGNOSIS — E782 Mixed hyperlipidemia: Secondary | ICD-10-CM | POA: Diagnosis not present

## 2019-01-05 DIAGNOSIS — E1140 Type 2 diabetes mellitus with diabetic neuropathy, unspecified: Secondary | ICD-10-CM | POA: Diagnosis not present

## 2019-01-05 DIAGNOSIS — I10 Essential (primary) hypertension: Secondary | ICD-10-CM | POA: Diagnosis not present

## 2019-01-31 ENCOUNTER — Telehealth: Payer: Self-pay | Admitting: *Deleted

## 2019-01-31 NOTE — Telephone Encounter (Signed)
Pt is scheduled for his COVID 19 screening on 02/07/2019.  Pt is aware to remain in quarantine once testing is done.  Pt voiced understanding.

## 2019-02-07 ENCOUNTER — Other Ambulatory Visit: Payer: Self-pay

## 2019-02-07 ENCOUNTER — Other Ambulatory Visit (HOSPITAL_COMMUNITY)
Admission: RE | Admit: 2019-02-07 | Discharge: 2019-02-07 | Disposition: A | Discharge status: Home / Self Care | Payer: PPO | Source: Ambulatory Visit | Attending: Gastroenterology | Admitting: Gastroenterology

## 2019-02-07 DIAGNOSIS — Z1159 Encounter for screening for other viral diseases: Secondary | ICD-10-CM | POA: Insufficient documentation

## 2019-02-07 DIAGNOSIS — I10 Essential (primary) hypertension: Secondary | ICD-10-CM | POA: Diagnosis not present

## 2019-02-07 DIAGNOSIS — E1140 Type 2 diabetes mellitus with diabetic neuropathy, unspecified: Secondary | ICD-10-CM | POA: Diagnosis not present

## 2019-02-07 DIAGNOSIS — E782 Mixed hyperlipidemia: Secondary | ICD-10-CM | POA: Diagnosis not present

## 2019-02-08 LAB — NOVEL CORONAVIRUS, NAA (HOSP ORDER, SEND-OUT TO REF LAB; TAT 18-24 HRS): SARS-CoV-2, NAA: NOT DETECTED

## 2019-02-09 ENCOUNTER — Telehealth: Payer: Self-pay

## 2019-02-09 NOTE — Telephone Encounter (Signed)
Called pt, TCS 02/10/19 moved up to 10:30am. Pt to arrive at 9:30am. Advised him to start drinking 2nd half of prep tomorrow morning at 5:30am. NPO after 7:30am. Verbalized understanding. Endo scheduler informed.

## 2019-02-10 ENCOUNTER — Ambulatory Visit (HOSPITAL_COMMUNITY)
Admission: RE | Admit: 2019-02-10 | Discharge: 2019-02-10 | Disposition: A | Discharge status: Home / Self Care | Payer: PPO | Attending: Gastroenterology | Admitting: Gastroenterology

## 2019-02-10 ENCOUNTER — Other Ambulatory Visit: Payer: Self-pay

## 2019-02-10 ENCOUNTER — Encounter (HOSPITAL_COMMUNITY)
Admission: RE | Disposition: A | Discharge status: Home / Self Care | Payer: Self-pay | Source: Home / Self Care | Attending: Gastroenterology

## 2019-02-10 ENCOUNTER — Encounter (HOSPITAL_COMMUNITY): Payer: Self-pay

## 2019-02-10 DIAGNOSIS — Z860100 Personal history of colon polyps, unspecified: Secondary | ICD-10-CM

## 2019-02-10 DIAGNOSIS — K635 Polyp of colon: Secondary | ICD-10-CM

## 2019-02-10 DIAGNOSIS — I10 Essential (primary) hypertension: Secondary | ICD-10-CM | POA: Insufficient documentation

## 2019-02-10 DIAGNOSIS — K5730 Diverticulosis of large intestine without perforation or abscess without bleeding: Secondary | ICD-10-CM | POA: Diagnosis not present

## 2019-02-10 DIAGNOSIS — Z7982 Long term (current) use of aspirin: Secondary | ICD-10-CM | POA: Insufficient documentation

## 2019-02-10 DIAGNOSIS — Z1211 Encounter for screening for malignant neoplasm of colon: Secondary | ICD-10-CM | POA: Diagnosis not present

## 2019-02-10 DIAGNOSIS — K644 Residual hemorrhoidal skin tags: Secondary | ICD-10-CM | POA: Diagnosis not present

## 2019-02-10 DIAGNOSIS — Z87891 Personal history of nicotine dependence: Secondary | ICD-10-CM | POA: Diagnosis not present

## 2019-02-10 DIAGNOSIS — K648 Other hemorrhoids: Secondary | ICD-10-CM | POA: Diagnosis not present

## 2019-02-10 DIAGNOSIS — Z79899 Other long term (current) drug therapy: Secondary | ICD-10-CM | POA: Diagnosis not present

## 2019-02-10 DIAGNOSIS — D123 Benign neoplasm of transverse colon: Secondary | ICD-10-CM | POA: Diagnosis not present

## 2019-02-10 DIAGNOSIS — E785 Hyperlipidemia, unspecified: Secondary | ICD-10-CM | POA: Diagnosis not present

## 2019-02-10 DIAGNOSIS — D122 Benign neoplasm of ascending colon: Secondary | ICD-10-CM | POA: Diagnosis not present

## 2019-02-10 HISTORY — PX: COLONOSCOPY: SHX5424

## 2019-02-10 HISTORY — PX: POLYPECTOMY: SHX5525

## 2019-02-10 SURGERY — COLONOSCOPY
Anesthesia: Moderate Sedation

## 2019-02-10 MED ORDER — MIDAZOLAM HCL 5 MG/5ML IJ SOLN
INTRAMUSCULAR | Status: DC | PRN
  Administered 2019-02-10: 1 mg via INTRAVENOUS
  Administered 2019-02-10 (×2): 2 mg via INTRAVENOUS

## 2019-02-10 MED ORDER — SODIUM CHLORIDE 0.9 % IV SOLN
INTRAVENOUS | Status: DC
  Administered 2019-02-10: 10:00:00 via INTRAVENOUS

## 2019-02-10 MED ORDER — MEPERIDINE HCL 100 MG/ML IJ SOLN
INTRAMUSCULAR | Status: AC
  Filled 2019-02-10: qty 2

## 2019-02-10 MED ORDER — MIDAZOLAM HCL 5 MG/5ML IJ SOLN
INTRAMUSCULAR | Status: AC
  Filled 2019-02-10: qty 10

## 2019-02-10 MED ORDER — STERILE WATER FOR IRRIGATION IR SOLN
Status: DC | PRN
  Administered 2019-02-10: 2.5 mL

## 2019-02-10 MED ORDER — MEPERIDINE HCL 100 MG/ML IJ SOLN
INTRAMUSCULAR | Status: DC | PRN
  Administered 2019-02-10 (×4): 25 mg via INTRAVENOUS

## 2019-02-10 NOTE — Op Note (Signed)
[facility] Patient Name: Ruben Lee Procedure Date: 02/10/2019 10:59 AM MRN: [id number] Date of Birth: 05-29-1951 Attending MD: Barney Drain MD, MD CSN: [id number] Age: 68 Admit Type: Outpatient Procedure:                Colonoscopy WITH COLD SNARE POLYPECTOMY Indications:              Personal history of colonic polyps Providers:                Barney Drain MD, MD, Otis Peak B. Sharon Seller, RN,                            Raphael Gibney, Technician Referring MD:             Edwinna Areola. [place] Crane MD Medicines:                Meperidine 100 mg IV, Midazolam 5 mg IV Complications:            No immediate complications. Estimated Blood Loss:     Estimated blood loss was minimal. Procedure:                Pre-Anesthesia Assessment:                           - Prior to the procedure, a History and Physical                            was performed, and patient medications and                            allergies were reviewed. The patient's tolerance of                            previous anesthesia was also reviewed. The risks                            and benefits of the procedure and the sedation                            options and risks were discussed with the patient.                            All questions were answered, and informed consent                            was obtained. Prior Anticoagulants: The patient has                            taken no previous anticoagulant or antiplatelet                            agents except for aspirin. ASA Grade Assessment: II                            - A patient with mild systemic disease. After  reviewing the risks and benefits, the patient was                            deemed in satisfactory condition to undergo the                            procedure. After obtaining informed consent, the                            colonoscope was passed under direct vision.                            Throughout the procedure,  the patient's blood                            pressure, pulse, and oxygen saturations were                            monitored continuously. The CF-HQ190L ([id number])                            scope was introduced through the anus and advanced                            to the the cecum, identified by appendiceal orifice                            and ileocecal valve. The colonoscopy was somewhat                            difficult due to a tortuous colon. Successful                            completion of the procedure was aided by                            straightening and shortening the scope to obtain                            bowel loop reduction and COLOWRAP. The patient                            tolerated the procedure well. The quality of the                            bowel preparation was excellent. The ileocecal                            valve, appendiceal orifice, and rectum were                            photographed. Scope In: 11:25:51 AM Scope Out: 11:43:08 AM Scope Withdrawal Time: 0 hours 14 minutes 3 seconds  Total Procedure Duration: 0 hours 17  minutes 17 seconds  Findings:      Three sessile polyps were found in the hepatic flexure and ascending       colon. The polyps were 3 to 4 mm in size. These polyps were removed with       a cold snare. Resection and retrieval were complete.      Multiple small and large-mouthed diverticula were found in the       recto-sigmoid colon, sigmoid colon and descending colon.      External and internal hemorrhoids were found. The hemorrhoids were large.      The recto-sigmoid colon, sigmoid colon and descending colon were       moderately tortuous. Impression:               - Three 3 to 4 mm polyps at the hepatic flexure and                            in the ascending colon(2), removed with a cold                            snare. Resected and retrieved.                           - MODERATE Diverticulosis in the  recto-sigmoid                            colon, in the sigmoid colon and in the descending                            colon.                           - External and internal hemorrhoids.                           - Tortuous colon. Moderate Sedation:      Moderate (conscious) sedation was administered by the endoscopy nurse       and supervised by the endoscopist. The following parameters were       monitored: oxygen saturation, heart rate, blood pressure, and response       to care. Total physician intraservice time was 31 minutes. Recommendation:           - Patient has a contact number available for                            emergencies. The signs and symptoms of potential                            delayed complications were discussed with the                            patient. Return to normal activities tomorrow.                            Written discharge instructions were provided to the  patient.                           - High fiber diet.                           - Continue present medications.                           - Await pathology results.                           - Repeat colonoscopy date to be determined after                            pending pathology results are reviewed for                            surveillance: 3 YEARS IF 2 SIMPLE ADENOMAS REMOVED. Procedure Code(s):        --- Professional ---                           [phone], Colonoscopy, flexible; with removal of                            tumor(s), polyp(s), or other lesion(s) by snare                            technique                           99153, Moderate sedation; each additional 15                            minutes intraservice time                           G0500, Moderate sedation services provided by the                            same physician or other qualified health care                            professional performing a gastrointestinal                             endoscopic service that sedation supports,                            requiring the presence of an independent trained                            observer to assist in the monitoring of the                            patient's level of consciousness and physiological  status; initial 15 minutes of intra-service time;                            patient age 52 years or older (additional time may                            be reported with [phone], as appropriate) Diagnosis Code(s):        --- Professional ---                           K63.5, Polyp of colon                           K64.8, Other hemorrhoids                           Z86.010, Personal history of colonic polyps                           K57.30, Diverticulosis of large intestine without                            perforation or abscess without bleeding                           Q43.8, Other specified congenital malformations of                            intestine CPT copyright 2019 American Medical Association. All rights reserved. The codes documented in this report are preliminary and upon coder review may  be revised to meet current compliance requirements. Barney Drain, MD Barney Drain MD, MD 02/10/2019 11:54:01 AM This report has been signed electronically. Number of Addenda: 0

## 2019-02-10 NOTE — H&P (Signed)
Primary Care Physician:  Celene Squibb, MD Primary Gastroenterologist:  Dr. Oneida Alar  Pre-Procedure History & Physical: HPI:  Ruben Lee is a 68 y.o. male here for  PERSONAL HISTORY OF POLYPS.  Past Medical History:  Diagnosis Date  . Hyperlipidemia   . Hypertension     Past Surgical History:  Procedure Laterality Date  . COLONOSCOPY N/A 09/16/2015   Procedure: COLONOSCOPY;  Surgeon: Danie Binder, MD;  Location: [facility];  Service: Endoscopy;  Laterality: N/A;  1230pm  . KNEE ARTHROSCOPY WITH MEDIAL MENISECTOMY Right 03/11/2016   Procedure: KNEE ARTHROSCOPY WITH MEDIAL MENISECTOMY;  Surgeon: Carole Civil, MD;  Location: [facility];  Service: Orthopedics;  Laterality: Right;  . None to date     As of 08/29/15    Prior to Admission medications   Medication Sig Start Date End Date Taking? Authorizing Provider  acetaminophen (TYLENOL) 500 MG tablet Take 1,000 mg by mouth at bedtime.   Yes [provider]  aspirin 81 MG tablet Take 81 mg by mouth daily.   Yes [provider]  fexofenadine (ALLEGRA) 180 MG tablet Take 90 mg by mouth daily.   Yes [provider]  lovastatin (MEVACOR) 10 MG tablet Take 10 mg by mouth at bedtime.  08/14/15  Yes [provider]  Multiple Vitamin (MULTIVITAMIN) capsule Take 1 capsule by mouth daily.   Yes [provider]  olmesartan (BENICAR) 40 MG tablet Take 20 mg by mouth daily.  07/18/18  Yes [provider]    Allergies as of 09/28/2018  . (No Known Allergies)    Family History  Problem Relation Age of Onset  . Colon cancer Cousin        "maybe a distant cousin"    Social History   Socioeconomic History  . Marital status: Widowed    Spouse name: Not on file  . Number of children: Not on file  . Years of education: Not on file  . Highest education level: Not on file  Occupational History  . Not on file  Social Needs  . Financial resource strain: Not on file  . Food  insecurity    Worry: Not on file    Inability: Not on file  . Transportation needs    Medical: Not on file    Non-medical: Not on file  Tobacco Use  . Smoking status: Former Smoker    Packs/day: 1.00    Years: 43.00    Pack years: 43.00    Types: Cigarettes    Quit date: 07/18/2010    Years since quitting: 8.5  . Smokeless tobacco: Never Used  Substance and Sexual Activity  . Alcohol use: No    Alcohol/week: 0.0 standard drinks    Comment: None in 20 years  . Drug use: No  . Sexual activity: Not on file  Lifestyle  . Physical activity    Days per week: Not on file    Minutes per session: Not on file  . Stress: Not on file  Relationships  . Social Herbalist on phone: Not on file    Gets together: Not on file    Attends religious service: Not on file    Active member of club or organization: Not on file    Attends meetings of clubs or organizations: Not on file    Relationship status: Not on file  . Intimate partner violence    Fear of current or ex partner: Not on file  Emotionally abused: Not on file    Physically abused: Not on file    Forced sexual activity: Not on file  Other Topics Concern  . Not on file  Social History Narrative  . Not on file    Review of Systems: See HPI, otherwise negative ROS   Physical Exam: BP (!) 160/83   Pulse 70   Temp 98.7 F (37.1 C) (Oral)   Resp 12   Ht 6\' 3"  (1.905 m)   Wt 95.7 kg   SpO2 100%   BMI 26.37 kg/m  General:   Alert,  pleasant and cooperative in NAD Head:  Normocephalic and atraumatic. Neck:  Supple; Lungs:  Clear throughout to auscultation.    Heart:  Regular rate and rhythm. Abdomen:  Soft, nontender and nondistended. Normal bowel sounds, without guarding, and without rebound.   Neurologic:  Alert and  oriented x4;  grossly normal neurologically.  Impression/Plan:     PERSONAL HISTORY OF POLYPS.  Plan:  1. TCS TODAY DISCUSSED PROCEDURE, BENEFITS, & RISKS: < 1% chance of medication  reaction, bleeding, perforation, ASPIRATION, or rupture of spleen/liver requiring surgery to fix it and missed polyps < 1 cm 10-20% of the time.

## 2019-02-10 NOTE — Discharge Instructions (Signed)
You have MODERATE internal hemorrhoids and diverticulosis IN YOUR LEFT AND RIGHT COLON. YOU HAD THREE POLYPS REMOVED.    DRINK WATER TO KEEP YOUR URINE LIGHT YELLOW.  FOLLOW A HIGH FIBER DIET. AVOID ITEMS THAT CAUSE BLOATING. See info below.   USE PREPARATION H FOUR TIMES A DAY FOR 3 DAYS THEN WHEN NEEDED TO RELIEVE RECTAL PAIN/PRESSURE/BLEEDING.   YOUR BIOPSY RESULTS WILL BE BACK IN 5 BUSINESS DAYS.  Next colonoscopy in 3 YEARS IF THREE SIMPLE ADENOMAS REMOVED AND OTHERWISE IN 5 years.  Colonoscopy Care After Read the instructions outlined below and refer to this sheet in the next week. These discharge instructions provide you with general information on caring for yourself after you leave the hospital. While your treatment has been planned according to the most current medical practices available, unavoidable complications occasionally occur. If you have any problems or questions after discharge, call DR. Valisa Karpel, [phone].  ACTIVITY  You may resume your regular activity, but move at a slower pace for the next 24 hours.   Take frequent rest periods for the next 24 hours.   Walking will help get rid of the air and reduce the bloated feeling in your belly (abdomen).   No driving for 24 hours (because of the medicine (anesthesia) used during the test).   You may shower.   Do not sign any important legal documents or operate any machinery for 24 hours (because of the anesthesia used during the test).    NUTRITION  Drink plenty of fluids.   You may resume your normal diet as instructed by your doctor.   Begin with a light meal and progress to your normal diet. Heavy or fried foods are harder to digest and may make you feel sick to your stomach (nauseated).   Avoid alcoholic beverages for 24 hours or as instructed.    MEDICATIONS  You may resume your normal medications.   WHAT YOU CAN EXPECT TODAY  Some feelings of bloating in the abdomen.   Passage of more gas  than usual.   Spotting of blood in your stool or on the toilet paper  .  IF YOU HAD POLYPS REMOVED DURING THE COLONOSCOPY:  Eat a soft diet IF YOU HAVE NAUSEA, BLOATING, ABDOMINAL PAIN, OR VOMITING.    FINDING OUT THE RESULTS OF YOUR TEST Not all test results are available during your visit. DR. Oneida Alar WILL CALL YOU WITHIN 14 DAYS OF YOUR PROCEDUE WITH YOUR RESULTS. Do not assume everything is normal if you have not heard from DR. Therin Vetsch, CALL HER OFFICE AT [phone].  SEEK IMMEDIATE MEDICAL ATTENTION AND CALL THE OFFICE: [phone] IF:  You have more than a spotting of blood in your stool.   Your belly is swollen (abdominal distention).   You are nauseated or vomiting.   You have a temperature over 101F.   You have abdominal pain or discomfort that is severe or gets worse throughout the day.  High-Fiber Diet A high-fiber diet changes your normal diet to include more whole grains, legumes, fruits, and vegetables. Changes in the diet involve replacing refined carbohydrates with unrefined foods. The calorie level of the diet is essentially unchanged. The Dietary Reference Intake (recommended amount) for adult males is 38 grams per day. For adult females, it is 25 grams per day. Pregnant and lactating women should consume 28 grams of fiber per day. Fiber is the intact part of a plant that is not broken down during digestion. Functional fiber is fiber that has been isolated  from the plant to provide a beneficial effect in the body.  PURPOSE  Increase stool bulk.   Ease and regulate bowel movements.   Lower cholesterol.   REDUCE RISK OF COLON CANCER  INDICATIONS THAT YOU NEED MORE FIBER  Constipation and hemorrhoids.   Uncomplicated diverticulosis (intestine condition) and irritable bowel syndrome.   Weight management.   As a protective measure against hardening of the arteries (atherosclerosis), diabetes, and cancer.   GUIDELINES FOR INCREASING FIBER IN THE  DIET  Start adding fiber to the diet slowly. A gradual increase of about 5 more grams (2 slices of whole-wheat bread, 2 servings of most fruits or vegetables, or 1 bowl of high-fiber cereal) per day is best. Too rapid an increase in fiber may result in constipation, flatulence, and bloating.   Drink enough water and fluids to keep your urine clear or pale yellow. Water, juice, or caffeine-free drinks are recommended. Not drinking enough fluid may cause constipation.   Eat a variety of high-fiber foods rather than one type of fiber.   Try to increase your intake of fiber through using high-fiber foods rather than fiber pills or supplements that contain small amounts of fiber.   The goal is to change the types of food eaten. Do not supplement your present diet with high-fiber foods, but replace foods in your present diet.   INCLUDE A VARIETY OF FIBER SOURCES  Replace refined and processed grains with whole grains, canned fruits with fresh fruits, and incorporate other fiber sources. White rice, white breads, and most bakery goods contain little or no fiber.   Brown whole-grain rice, buckwheat oats, and many fruits and vegetables are all good sources of fiber. These include: broccoli, Brussels sprouts, cabbage, cauliflower, beets, sweet potatoes, white potatoes (skin on), carrots, tomatoes, eggplant, squash, berries, fresh fruits, and dried fruits.   Cereals appear to be the richest source of fiber. Cereal fiber is found in whole grains and bran. Bran is the fiber-rich outer coat of cereal grain, which is largely removed in refining. In whole-grain cereals, the bran remains. In breakfast cereals, the largest amount of fiber is found in those with "bran" in their names. The fiber content is sometimes indicated on the label.   You may need to include additional fruits and vegetables each day.   In baking, for 1 cup white flour, you may use the following substitutions:   1 cup whole-wheat flour  minus 2 tablespoons.   1/2 cup white flour plus 1/2 cup whole-wheat flour.   Polyps, Colon  A polyp is extra tissue that grows inside your body. Colon polyps grow in the large intestine. The large intestine, also called the colon, is part of your digestive system. It is a long, hollow tube at the end of your digestive tract where your body makes and stores stool. Most polyps are not dangerous. They are benign. This means they are not cancerous. But over time, some types of polyps can turn into cancer. Polyps that are smaller than a pea are usually not harmful. But larger polyps could someday become or may already be cancerous. To be safe, doctors remove all polyps and test them.   PREVENTION There is not one sure way to prevent polyps. You might be able to lower your risk of getting them if you:  Eat more fruits and vegetables and less fatty food.   Do not smoke.   Avoid alcohol.   Exercise every day.   Lose weight if you are overweight.  Eating more calcium and folate can also lower your risk of getting polyps. Some foods that are rich in calcium are milk, cheese, and broccoli. Some foods that are rich in folate are chickpeas, kidney beans, and spinach.    Diverticulosis Diverticulosis is a common condition that develops when small pouches (diverticula) form in the wall of the colon. The risk of diverticulosis increases with age. It happens more often in people who eat a low-fiber diet. Most individuals with diverticulosis have no symptoms. Those individuals with symptoms usually experience belly (abdominal) pain, constipation, or loose stools (diarrhea).  HOME CARE INSTRUCTIONS  Increase the amount of fiber in your diet as directed by your caregiver or dietician. This may reduce symptoms of diverticulosis.   Drink at least 6 to 8 glasses of water each day to prevent constipation.   Try not to strain when you have a bowel movement.   Avoiding nuts and seeds to prevent  complications is NOT NECESSARY.   FOODS HAVING HIGH FIBER CONTENT INCLUDE:  Fruits. Apple, peach, pear, tangerine, raisins, prunes.   Vegetables. Brussels sprouts, asparagus, broccoli, cabbage, carrot, cauliflower, romaine lettuce, spinach, summer squash, tomato, winter squash, zucchini.   Starchy Vegetables. Baked beans, kidney beans, lima beans, split peas, lentils, potatoes (with skin).   Grains. Whole wheat bread, brown rice, bran flake cereal, plain oatmeal, white rice, shredded wheat, bran muffins.   SEEK IMMEDIATE MEDICAL CARE IF:  You develop increasing pain or severe bloating.   You have an oral temperature above 101F.   You develop vomiting or bowel movements that are bloody or black.   Hemorrhoids Hemorrhoids are dilated (enlarged) veins around the rectum. Sometimes clots will form in the veins. This makes them swollen and painful. These are called thrombosed hemorrhoids. Causes of hemorrhoids include:  Constipation.   Straining to have a bowel movement.   HEAVY LIFTING   HOME CARE INSTRUCTIONS  Eat a well balanced diet and drink 6 to 8 glasses of water every day to avoid constipation. You may also use a bulk laxative.   Avoid straining to have bowel movements.   Keep anal area dry and clean.   Do not use a donut shaped pillow or sit on the toilet for long periods. This increases blood pooling and pain.   Move your bowels when your body has the urge; this will require less straining and will decrease pain and pressure.

## 2019-02-13 NOTE — Progress Notes (Signed)
Pt is aware. Said he just got a note in [place] that his next colonoscopy would be in 10 years. Dr. Oneida Alar, please advise!

## 2019-02-14 NOTE — Progress Notes (Signed)
Pt is aware.  

## 2019-02-16 ENCOUNTER — Encounter (HOSPITAL_COMMUNITY): Payer: Self-pay | Admitting: Gastroenterology

## 2019-03-03 DIAGNOSIS — E782 Mixed hyperlipidemia: Secondary | ICD-10-CM | POA: Diagnosis not present

## 2019-03-03 DIAGNOSIS — I10 Essential (primary) hypertension: Secondary | ICD-10-CM | POA: Diagnosis not present

## 2019-03-03 DIAGNOSIS — E1140 Type 2 diabetes mellitus with diabetic neuropathy, unspecified: Secondary | ICD-10-CM | POA: Diagnosis not present

## 2019-03-16 ENCOUNTER — Other Ambulatory Visit: Payer: Self-pay

## 2019-04-04 DIAGNOSIS — E1140 Type 2 diabetes mellitus with diabetic neuropathy, unspecified: Secondary | ICD-10-CM | POA: Diagnosis not present

## 2019-04-04 DIAGNOSIS — E119 Type 2 diabetes mellitus without complications: Secondary | ICD-10-CM | POA: Diagnosis not present

## 2019-04-04 DIAGNOSIS — I10 Essential (primary) hypertension: Secondary | ICD-10-CM | POA: Diagnosis not present

## 2019-04-04 DIAGNOSIS — E782 Mixed hyperlipidemia: Secondary | ICD-10-CM | POA: Diagnosis not present

## 2019-04-27 DIAGNOSIS — R7301 Impaired fasting glucose: Secondary | ICD-10-CM | POA: Diagnosis not present

## 2019-04-27 DIAGNOSIS — I10 Essential (primary) hypertension: Secondary | ICD-10-CM | POA: Diagnosis not present

## 2019-04-27 DIAGNOSIS — E1169 Type 2 diabetes mellitus with other specified complication: Secondary | ICD-10-CM | POA: Diagnosis not present

## 2019-04-27 DIAGNOSIS — Z125 Encounter for screening for malignant neoplasm of prostate: Secondary | ICD-10-CM | POA: Diagnosis not present

## 2019-04-27 DIAGNOSIS — E041 Nontoxic single thyroid nodule: Secondary | ICD-10-CM | POA: Diagnosis not present

## 2019-04-27 DIAGNOSIS — E1140 Type 2 diabetes mellitus with diabetic neuropathy, unspecified: Secondary | ICD-10-CM | POA: Diagnosis not present

## 2019-04-27 DIAGNOSIS — E782 Mixed hyperlipidemia: Secondary | ICD-10-CM | POA: Diagnosis not present

## 2019-05-01 DIAGNOSIS — I10 Essential (primary) hypertension: Secondary | ICD-10-CM | POA: Diagnosis not present

## 2019-05-01 DIAGNOSIS — E041 Nontoxic single thyroid nodule: Secondary | ICD-10-CM | POA: Diagnosis not present

## 2019-05-01 DIAGNOSIS — E1140 Type 2 diabetes mellitus with diabetic neuropathy, unspecified: Secondary | ICD-10-CM | POA: Diagnosis not present

## 2019-05-01 DIAGNOSIS — E782 Mixed hyperlipidemia: Secondary | ICD-10-CM | POA: Diagnosis not present

## 2019-05-04 DIAGNOSIS — M25561 Pain in right knee: Secondary | ICD-10-CM | POA: Diagnosis not present

## 2019-05-04 DIAGNOSIS — E782 Mixed hyperlipidemia: Secondary | ICD-10-CM | POA: Diagnosis not present

## 2019-05-04 DIAGNOSIS — E1140 Type 2 diabetes mellitus with diabetic neuropathy, unspecified: Secondary | ICD-10-CM | POA: Diagnosis not present

## 2019-05-04 DIAGNOSIS — E041 Nontoxic single thyroid nodule: Secondary | ICD-10-CM | POA: Diagnosis not present

## 2019-05-04 DIAGNOSIS — I10 Essential (primary) hypertension: Secondary | ICD-10-CM | POA: Diagnosis not present

## 2019-05-30 DIAGNOSIS — I10 Essential (primary) hypertension: Secondary | ICD-10-CM | POA: Diagnosis not present

## 2019-05-30 DIAGNOSIS — E041 Nontoxic single thyroid nodule: Secondary | ICD-10-CM | POA: Diagnosis not present

## 2019-05-30 DIAGNOSIS — E1140 Type 2 diabetes mellitus with diabetic neuropathy, unspecified: Secondary | ICD-10-CM | POA: Diagnosis not present

## 2019-05-30 DIAGNOSIS — E782 Mixed hyperlipidemia: Secondary | ICD-10-CM | POA: Diagnosis not present

## 2019-05-31 DIAGNOSIS — E119 Type 2 diabetes mellitus without complications: Secondary | ICD-10-CM | POA: Diagnosis not present

## 2019-06-30 DIAGNOSIS — H40003 Preglaucoma, unspecified, bilateral: Secondary | ICD-10-CM | POA: Diagnosis not present

## 2019-07-12 ENCOUNTER — Other Ambulatory Visit: Payer: Self-pay

## 2019-08-24 DIAGNOSIS — E782 Mixed hyperlipidemia: Secondary | ICD-10-CM | POA: Diagnosis not present

## 2019-08-24 DIAGNOSIS — E1140 Type 2 diabetes mellitus with diabetic neuropathy, unspecified: Secondary | ICD-10-CM | POA: Diagnosis not present

## 2019-08-24 DIAGNOSIS — E7849 Other hyperlipidemia: Secondary | ICD-10-CM | POA: Diagnosis not present

## 2019-08-24 DIAGNOSIS — I10 Essential (primary) hypertension: Secondary | ICD-10-CM | POA: Diagnosis not present

## 2019-08-24 DIAGNOSIS — E041 Nontoxic single thyroid nodule: Secondary | ICD-10-CM | POA: Diagnosis not present

## 2019-10-24 DIAGNOSIS — I10 Essential (primary) hypertension: Secondary | ICD-10-CM | POA: Diagnosis not present

## 2019-10-24 DIAGNOSIS — E1140 Type 2 diabetes mellitus with diabetic neuropathy, unspecified: Secondary | ICD-10-CM | POA: Diagnosis not present

## 2019-10-24 DIAGNOSIS — E041 Nontoxic single thyroid nodule: Secondary | ICD-10-CM | POA: Diagnosis not present

## 2019-10-24 DIAGNOSIS — E782 Mixed hyperlipidemia: Secondary | ICD-10-CM | POA: Diagnosis not present

## 2019-10-27 DIAGNOSIS — R7301 Impaired fasting glucose: Secondary | ICD-10-CM | POA: Diagnosis not present

## 2019-10-27 DIAGNOSIS — E119 Type 2 diabetes mellitus without complications: Secondary | ICD-10-CM | POA: Diagnosis not present

## 2019-10-27 DIAGNOSIS — E1140 Type 2 diabetes mellitus with diabetic neuropathy, unspecified: Secondary | ICD-10-CM | POA: Diagnosis not present

## 2019-10-27 DIAGNOSIS — E875 Hyperkalemia: Secondary | ICD-10-CM | POA: Diagnosis not present

## 2019-10-27 DIAGNOSIS — R945 Abnormal results of liver function studies: Secondary | ICD-10-CM | POA: Diagnosis not present

## 2019-10-27 DIAGNOSIS — E782 Mixed hyperlipidemia: Secondary | ICD-10-CM | POA: Diagnosis not present

## 2019-10-27 DIAGNOSIS — E0840 Diabetes mellitus due to underlying condition with diabetic neuropathy, unspecified: Secondary | ICD-10-CM | POA: Diagnosis not present

## 2019-10-27 DIAGNOSIS — Z712 Person consulting for explanation of examination or test findings: Secondary | ICD-10-CM | POA: Diagnosis not present

## 2019-10-27 DIAGNOSIS — E041 Nontoxic single thyroid nodule: Secondary | ICD-10-CM | POA: Diagnosis not present

## 2019-10-27 DIAGNOSIS — I10 Essential (primary) hypertension: Secondary | ICD-10-CM | POA: Diagnosis not present

## 2019-10-27 DIAGNOSIS — L501 Idiopathic urticaria: Secondary | ICD-10-CM | POA: Diagnosis not present

## 2019-10-31 DIAGNOSIS — E782 Mixed hyperlipidemia: Secondary | ICD-10-CM | POA: Diagnosis not present

## 2019-10-31 DIAGNOSIS — E1140 Type 2 diabetes mellitus with diabetic neuropathy, unspecified: Secondary | ICD-10-CM | POA: Diagnosis not present

## 2019-10-31 DIAGNOSIS — M25561 Pain in right knee: Secondary | ICD-10-CM | POA: Diagnosis not present

## 2019-10-31 DIAGNOSIS — E041 Nontoxic single thyroid nodule: Secondary | ICD-10-CM | POA: Diagnosis not present

## 2019-10-31 DIAGNOSIS — I10 Essential (primary) hypertension: Secondary | ICD-10-CM | POA: Diagnosis not present

## 2019-10-31 DIAGNOSIS — Z0001 Encounter for general adult medical examination with abnormal findings: Secondary | ICD-10-CM | POA: Diagnosis not present

## 2019-11-28 DIAGNOSIS — E041 Nontoxic single thyroid nodule: Secondary | ICD-10-CM | POA: Diagnosis not present

## 2019-11-28 DIAGNOSIS — I10 Essential (primary) hypertension: Secondary | ICD-10-CM | POA: Diagnosis not present

## 2019-11-28 DIAGNOSIS — E1140 Type 2 diabetes mellitus with diabetic neuropathy, unspecified: Secondary | ICD-10-CM | POA: Diagnosis not present

## 2019-11-28 DIAGNOSIS — E782 Mixed hyperlipidemia: Secondary | ICD-10-CM | POA: Diagnosis not present

## 2019-12-20 ENCOUNTER — Other Ambulatory Visit: Payer: Self-pay

## 2019-12-20 ENCOUNTER — Encounter: Payer: Self-pay | Admitting: Cardiovascular Disease

## 2019-12-20 ENCOUNTER — Ambulatory Visit: Payer: PPO | Admitting: Cardiovascular Disease

## 2019-12-20 VITALS — BP 138/80 | HR 76 | Temp 98.1°F | Ht 75.0 in | Wt 216.0 lb

## 2019-12-20 DIAGNOSIS — R55 Syncope and collapse: Secondary | ICD-10-CM | POA: Diagnosis not present

## 2019-12-20 DIAGNOSIS — I10 Essential (primary) hypertension: Secondary | ICD-10-CM | POA: Diagnosis not present

## 2019-12-20 DIAGNOSIS — I499 Cardiac arrhythmia, unspecified: Secondary | ICD-10-CM

## 2019-12-20 DIAGNOSIS — R42 Dizziness and giddiness: Secondary | ICD-10-CM

## 2019-12-20 NOTE — Patient Instructions (Addendum)
Medication Instructions:  Continue all current medications.  Labwork: none  Testing/Procedures:  Your physician has requested that you have an echocardiogram. Echocardiography is a painless test that uses sound waves to create images of your heart. It provides your doctor with information about the size and shape of your heart and how well your heart's chambers and valves are working. This procedure takes approximately one hour. There are no restrictions for this procedure.  Your physician has recommended that you wear a 30 day event monitor. Event monitors are medical devices that record the heart's electrical activity. Doctors most often us these monitors to diagnose arrhythmias. Arrhythmias are problems with the speed or rhythm of the heartbeat. The monitor is a small, portable device. You can wear one while you do your normal daily activities. This is usually used to diagnose what is causing palpitations/syncope (passing out).  Office will contact with results via phone or letter.    Follow-Up: 3 months   Any Other Special Instructions Will Be Listed Below (If Applicable).  If you need a refill on your cardiac medications before your next appointment, please call your pharmacy.  

## 2019-12-20 NOTE — Progress Notes (Addendum)
CARDIOLOGY CONSULT NOTE  Patient ID: Ruben Lee MRN: [id number] DOB/AGE: 69-Apr-1952 69 y.o.  Admit date: (Not on file) Primary Physician: Celene Squibb, MD  Reason for Consultation: Dizziness  HPI: Ruben Lee is a 69 y.o. male who is being seen today for the evaluation of dizziness at the request of Celene Squibb, MD.   I reviewed records from his PCP.  As per Dr. Juel Burrow notes, the patient has episodes of dizziness and lightheadedness which lasted for about 5 seconds and seem to come on with belching.  Heart rate appears to have dropped by 20 bpm during 1 of these occasions.  There is no history of chest pain or shortness of breath.  He was prescribed omeprazole.  Labs on 10/27/2019 showed hemoglobin 15.3, platelets 152, BUN 16, creatinine 0.9, sodium 141, potassium 5.3, total cholesterol 165, triglycerides 65, HDL 58, LDL 94, hemoglobin A1c 6%.  Upon speaking with him, he tells me that symptoms have been going on for the past 1-1/2 years.  They do not occur when he is standing up from a seated position.  He exercises by doing 30 minutes on elliptical each morning and evening and then does a fast walk for 15 minutes each day.  He will then experience the sudden onset of dizziness which last for seconds.  They have occurred when he is resting and the symptoms have lasted as long as 4 to 5 hours.  He denies associated chest pain or shortness of breath.  He has occasional palpitations.  He recently purchased a new BP monitor which has detected "arrhythmia, possible atrial fibrillation ".  He showed me this on his phone.  His blood pressure will elevate by 20 points in last 4 days at this level.  He quit smoking 10 years ago.  After taking omeprazole he felt better for the first 5 days but then symptoms recurred.  He denies frank syncope.  ECG performed in the office today which I ordered and personally interpreted demonstrates normal sinus rhythm with no ischemic ST  segment or T-wave abnormalities, nor any arrhythmias.     No Known Allergies  Current Outpatient Medications  Medication Sig Dispense Refill  . aspirin 81 MG tablet Take 81 mg by mouth daily.    . Cetirizine HCl 10 MG TBDP     . lovastatin (MEVACOR) 10 MG tablet Take 10 mg by mouth at bedtime.   0  . Multiple Vitamin (MULTIVITAMIN) capsule Take 1 capsule by mouth daily.    Marland Kitchen olmesartan (BENICAR) 20 MG tablet Take 20 mg by mouth daily.     No current facility-administered medications for this visit.    Past Medical History:  Diagnosis Date  . Hyperlipidemia   . Hypertension     Past Surgical History:  Procedure Laterality Date  . COLONOSCOPY N/A 09/16/2015   Procedure: COLONOSCOPY;  Surgeon: Danie Binder, MD;  [facility];  Service: Endoscopy;  Laterality: N/A;  1230pm  . COLONOSCOPY N/A 02/10/2019   Procedure: COLONOSCOPY;  Surgeon: Danie Binder, MD;  [facility];  Service: Endoscopy;  Laterality: N/A;  2:00  . KNEE ARTHROSCOPY WITH MEDIAL MENISECTOMY Right 03/11/2016   Procedure: KNEE ARTHROSCOPY WITH MEDIAL MENISECTOMY;  Surgeon: Carole Civil, MD;  Location: [facility];  Service: Orthopedics;  Laterality: Right;  . None to date     As of 08/29/15  . POLYPECTOMY  02/10/2019   Procedure: POLYPECTOMY;  Surgeon: Barney Drain  L, MD;  Location: [facility];  Service: Endoscopy;;  colon    Social History   Socioeconomic History  . Marital status: Widowed    Spouse name: Not on file  . Number of children: Not on file  . Years of education: Not on file  . Highest education level: Not on file  Occupational History  . Not on file  Tobacco Use  . Smoking status: Former Smoker    Packs/day: 1.00    Years: 43.00    Pack years: 43.00    Types: Cigarettes    Quit date: 07/18/2010    Years since quitting: 9.4  . Smokeless tobacco: Never Used  Substance and Sexual Activity  . Alcohol use: No    Alcohol/week: 0.0 standard drinks    Comment: None  in 20 years  . Drug use: No  . Sexual activity: Not on file  Other Topics Concern  . Not on file  Social History Narrative  . Not on file   Social Determinants of Health   Financial Resource Strain:   . Difficulty of Paying Living Expenses:   Food Insecurity:   . Worried About Charity fundraiser in the Last Year:   . Arboriculturist in the Last Year:   Transportation Needs:   . Film/video editor (Medical):   Marland Kitchen Lack of Transportation (Non-Medical):   Physical Activity:   . Days of Exercise per Week:   . Minutes of Exercise per Session:   Stress:   . Feeling of Stress :   Social Connections:   . Frequency of Communication with Friends and Family:   . Frequency of Social Gatherings with Friends and Family:   . Attends Religious Services:   . Active Member of Clubs or Organizations:   . Attends Archivist Meetings:   Marland Kitchen Marital Status:   Intimate Partner Violence:   . Fear of Current or Ex-Partner:   . Emotionally Abused:   Marland Kitchen Physically Abused:   . Sexually Abused:      No family history of premature CAD in 1st degree relatives.  Current Meds  Medication Sig  . aspirin 81 MG tablet Take 81 mg by mouth daily.  . Cetirizine HCl 10 MG TBDP   . lovastatin (MEVACOR) 10 MG tablet Take 10 mg by mouth at bedtime.   . Multiple Vitamin (MULTIVITAMIN) capsule Take 1 capsule by mouth daily.  Marland Kitchen olmesartan (BENICAR) 20 MG tablet Take 20 mg by mouth daily.      Review of systems complete and found to be negative unless listed above in HPI    Physical exam Blood pressure 138/80, pulse 76, temperature 98.1 F (36.7 C), height 6\' 3"  (1.905 m), weight 216 lb (98 kg), SpO2 98 %. General: NAD Neck: No JVD, no thyromegaly or thyroid nodule.  Lungs: Clear to auscultation bilaterally with normal respiratory effort. CV: Nondisplaced PMI. Regular rate and rhythm, normal S1/S2, no S3/S4, no murmur.  No peripheral edema.  No carotid bruit.    Abdomen: Soft, nontender, no  distention.  Skin: Intact without lesions or rashes.  Neurologic: Alert and oriented x 3.  Psych: Normal affect. Extremities: No clubbing or cyanosis.  HEENT: Normal.   ECG: Most recent ECG reviewed.   Labs: Lab Results  Component Value Date/Time   K 4.7 03/09/2016 02:34 PM   BUN 19 03/09/2016 02:34 PM   CREATININE 0.88 03/09/2016 02:34 PM   HGB 15.1 03/09/2016 02:34 PM     Lipids:  No results found for: LDLCALC, LDLDIRECT, CHOL, TRIG, HDL      ASSESSMENT AND PLAN:   1.  Dizziness/lightheadedness with possible arrhythmia: Unclear etiology.  He has occasional palpitations.  BP monitor notified him of possible arrhythmia/atrial fibrillation.  I will obtain a 30-day event monitor. I will order a 2-D echocardiogram with Doppler to evaluate cardiac structure, function, and regional wall motion. He does not describe anything to suggest anginal symptoms.  2.  Hypertension: BP is normal.  No change to therapy.    Disposition: Follow up in 3 months virtual visit  Signed: Kate Sable, M.D., F.A.C.C.  12/20/2019, 10:49 AM    I received an update from the cardiac monitoring company.  The patient had a 4.1-second pause at 3:59 AM.  This reportedly occurred while he was sleeping.  I will await final results.

## 2019-12-28 ENCOUNTER — Ambulatory Visit (HOSPITAL_COMMUNITY)
Admission: RE | Admit: 2019-12-28 | Discharge: 2019-12-28 | Disposition: A | Discharge status: Home / Self Care | Payer: PPO | Source: Ambulatory Visit | Attending: Cardiovascular Disease | Admitting: Cardiovascular Disease

## 2019-12-28 ENCOUNTER — Other Ambulatory Visit: Payer: Self-pay

## 2019-12-28 DIAGNOSIS — I499 Cardiac arrhythmia, unspecified: Secondary | ICD-10-CM | POA: Diagnosis not present

## 2019-12-28 DIAGNOSIS — R55 Syncope and collapse: Secondary | ICD-10-CM

## 2019-12-28 NOTE — Progress Notes (Signed)
*  PRELIMINARY RESULTS* Echocardiogram 2D Echocardiogram has been performed.  Patient's blood pressure was 193/92 after 5 minutes blood pressure 170/81. Patient checks his blood pressure every morning and night. This morning it was 119/78. Patient stated sometimes it is higher at doctor's office. Patient reported no dizziness, lightheaded or headache. Instructed patient to check blood pressure again tonight and in the am, if it remains high, call the doctors office. Made Dr. Harl Bowie aware of the situation.  Leavy Cella 12/28/2019, 3:48 PM

## 2019-12-29 ENCOUNTER — Ambulatory Visit (INDEPENDENT_AMBULATORY_CARE_PROVIDER_SITE_OTHER): Payer: PPO

## 2019-12-29 ENCOUNTER — Other Ambulatory Visit: Payer: Self-pay

## 2019-12-29 DIAGNOSIS — I499 Cardiac arrhythmia, unspecified: Secondary | ICD-10-CM

## 2019-12-29 DIAGNOSIS — R55 Syncope and collapse: Secondary | ICD-10-CM

## 2020-01-02 NOTE — Progress Notes (Deleted)
I received an update from the cardiac monitoring company.  The patient had a 4.1-second pause at 3:59 AM.  This reportedly occurred while he was sleeping.  I will await final results.

## 2020-01-05 ENCOUNTER — Telehealth: Payer: Self-pay

## 2020-01-05 DIAGNOSIS — R001 Bradycardia, unspecified: Secondary | ICD-10-CM

## 2020-01-05 NOTE — Telephone Encounter (Signed)
Patient aware he will receive a call from Gracy Bruins fro EP

## 2020-01-05 NOTE — Telephone Encounter (Signed)
Per Dr.Koneswaran, patient needs urgent evaluation in EP for 4.8 second from [employer] event monitor

## 2020-01-08 ENCOUNTER — Ambulatory Visit (INDEPENDENT_AMBULATORY_CARE_PROVIDER_SITE_OTHER): Payer: PPO | Admitting: Internal Medicine

## 2020-01-08 ENCOUNTER — Encounter: Payer: Self-pay | Admitting: Internal Medicine

## 2020-01-08 VITALS — BP 102/71 | HR 79 | Ht 75.0 in | Wt 208.0 lb

## 2020-01-08 DIAGNOSIS — R42 Dizziness and giddiness: Secondary | ICD-10-CM

## 2020-01-08 DIAGNOSIS — R001 Bradycardia, unspecified: Secondary | ICD-10-CM

## 2020-01-08 NOTE — Progress Notes (Signed)
Electrophysiology TeleHealth Note   Due to national recommendations of social distancing due to [place] 19, Audio telehealth visit is felt to be most appropriate for this patient at this time.  See MyChart message from today for patient consent regarding telehealth for [facility]   Date:  01/15/2020   ID:  Ruben Lee, DOB Mar 04, 1951, MRN [id number]  Location: home Provider location: [place] Evaluation Performed: New patient consult  PCP:  Celene Squibb, MD  Cardiologist:  Dr Bronson Ing Electrophysiologist:  None   Chief Complaint:  pauses  History of Present Illness:    Ruben Lee is a 69 y.o. male who presents via audio/video conferencing for a telehealth visit today.   The patient is referred for new consultation regarding pauses by Dr Bronson Ing.   He reports having occasional chest palpitations and chest discomfort.  He describes this as a sensation as though he had a "burb which gets stuck".  He has occasional lightheadedness with these events.  He has associated dizziness and leg weakness.  He reports that episodes typically last less than 10 seconds. He also has transient dizziness with exercising on elliptical.  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  He is referred to EP for further evaluation.  Today, he denies symptoms of palpitations, orthopnea, PND, lower extremity edema, claudication,  syncope, bleeding, or neurologic sequela. The patient is tolerating medications without difficulties and is otherwise without complaint today.     Past Medical History:  Diagnosis Date  . Hyperlipidemia   . Hypertension     Past Surgical History:  Procedure Laterality Date  . COLONOSCOPY N/A 09/16/2015   Procedure: COLONOSCOPY;  Surgeon: Danie Binder, MD;  [facility];  Service: Endoscopy;  Laterality: N/A;  1230pm  . COLONOSCOPY N/A 02/10/2019   Procedure: COLONOSCOPY;  Surgeon: Danie Binder, MD;  [facility];  Service: Endoscopy;  Laterality: N/A;  2:00  . KNEE ARTHROSCOPY WITH MEDIAL MENISECTOMY Right 03/11/2016   Procedure: KNEE ARTHROSCOPY WITH MEDIAL MENISECTOMY;  Surgeon: Carole Civil, MD;  Location: [facility];  Service: Orthopedics;  Laterality: Right;  . None to date     As of 08/29/15  . POLYPECTOMY  02/10/2019   Procedure: POLYPECTOMY;  Surgeon: Danie Binder, MD;  [facility];  Service: Endoscopy;;  colon    Current Outpatient Medications  Medication Sig Dispense Refill  . aspirin 81 MG tablet Take 81 mg by mouth daily.    . Cetirizine HCl 10 MG TBDP     . lovastatin (MEVACOR) 10 MG tablet Take 10 mg by mouth at bedtime.   0  . Multiple Vitamin (MULTIVITAMIN) capsule Take 1 capsule by mouth daily.    Marland Kitchen olmesartan (BENICAR) 20 MG tablet Take 20 mg by mouth daily.     No current facility-administered medications for this visit.    Allergies:   Patient has no known allergies.   Social History:  The patient  reports that he quit smoking about 9 years ago. His smoking use included cigarettes. He has a 43.00 pack-year smoking history. He has never used smokeless tobacco. He reports that he does not drink alcohol or use drugs.   Family History:  The patient's family history includes COPD in his mother; Colon cancer in his cousin; Heart attack in his father.    ROS:  Please see the history of present illness.   All other  systems are personally reviewed and negative.    Exam:    Vital Signs:  BP 102/71   Pulse 79   Ht 6\' 3"  (1.905 m)   Wt 208 lb (94.3 kg)   BMI 26.00 kg/m    Well sounding, alert and conversant    Labs/Other Tests and Data Reviewed:    Recent Labs: No results found for requested labs within last 8760 hours.   Wt Readings from Last 3 Encounters:  01/08/20 208 lb (94.3 kg)  12/20/19 216 lb (98 kg)  02/10/19 211 lb (95.7 kg)     Other studies personally reviewed: Additional studies/ records that were  reviewed today include: echo, Dr Raylene Everts notes,   Review of the above records today demonstrates: as above   ASSESSMENT & PLAN:    1.  Dizziness, pauses Unfortunately, I do not have any strips to review He has had only nocturnal bradycardia.  I currently do not have strips to review.  These have been requested.  By history, he was not symptomatic with these events. He currently does not have an indication for pacing. I will order a sleep study to evaluate for sleep apnea as a possible cause for his nocturnal pauses. If he has symptoms with his brady events or daytime events, then more close consideration will be required at that time. He is aware to not drive if he has syncope.  2. HTN Stable No change required today   Follow-up: me in 4-6 weeks after his monitor is resulted. If he has symptomatic daytime arrhythmias then a more aggressive approach would be considered.  Patient Risk:  after full review of this patients clinical status, I feel that they are at moderate risk at this time.  Today, I have spent 20 minutes with the patient with telehealth technology discussing dizziness .    Signed, Thompson Grayer MD, [place]   [address] [place] [address] [phone] (office) [phone] (fax)

## 2020-01-10 ENCOUNTER — Telehealth: Payer: Self-pay | Admitting: Cardiology

## 2020-01-10 DIAGNOSIS — I499 Cardiac arrhythmia, unspecified: Secondary | ICD-10-CM

## 2020-01-10 NOTE — Telephone Encounter (Signed)
    Received [employer] page regarding Ruben Lee, 11-12-1950 having multiple pausing with the longest at 5.4 seconds and bradycardia rates in the 20 to 30 bpm range.  Patient contacted by overnight fellow and reported that he was asymptomatic.  Will forward message to primary cardiologist as well as EP cardiologist for review of strips.  Kathyrn Drown NP-C [place] Pager: [phone]

## 2020-01-10 NOTE — Telephone Encounter (Signed)
I have been in touch with Dr. Rayann Heman who recently evaluated him. He will need a pacemaker. He should be directed to go to the [facility] ED.

## 2020-01-10 NOTE — Telephone Encounter (Signed)
Called [place] office who received faxed strips. They will have scanned into chart.  Called and spoke with patient, last night, he was asleep in bed and was awakened around the time that the pause was detected on monitor per patient.   He feels well today. No dizziness, syncope.   Will have Dr Rayann Heman review strips once they are scanned into chart and make disposition from there  Chanetta Marshall, NP 01/10/2020 12:25 PM

## 2020-01-10 NOTE — Telephone Encounter (Signed)
Reviewed strips with Dr Rayann Heman, all have P-P prolongation and are consistent with sleep apnea. The patient does snore but has never had a sleep study. He is agreeable to one. Will order sleep study and continue to monitor for symptomatic episodes during daytime hours.  Pt aware and agrees with plan.  Chanetta Marshall, NP 01/10/2020 2:01 PM

## 2020-01-10 NOTE — Telephone Encounter (Signed)
Strips have been given to Lynnell Jude, NP for review. She has spoken to the patient.

## 2020-01-11 ENCOUNTER — Telehealth: Payer: Self-pay | Admitting: *Deleted

## 2020-01-11 NOTE — Telephone Encounter (Signed)
Per [facility] sleep sent for precert.

## 2020-01-11 NOTE — Addendum Note (Signed)
Addended by: Freada Bergeron on: 01/11/2020 07:09 PM   Modules accepted: Orders

## 2020-01-11 NOTE — Telephone Encounter (Signed)
Received critical event report on pt. Of 7 sec pause and Bradyarrhythmia. Spoke with pt who states that he was sleeping at that time but did wake up around midnight. EKG strips faxed to Dr. Rayann Heman and nurse Sonia Baller.

## 2020-01-12 ENCOUNTER — Telehealth: Payer: Self-pay

## 2020-01-12 NOTE — Telephone Encounter (Signed)
Additional EKG strips received from [employer].  Reviewed by DOD and verbally reviewed with Dr. Rayann Heman.  Pt is having asymptomatic night time pauses.  Per Dr. Delphina Cahill have [employer] stop sending alerts during hours of 9:00 pm to 8:00 am.   Call placed to [employer].  Requested no alerts be sent from 9:00 pm to 8:00 am.  [employer] states they will add this to Pt chart.

## 2020-01-14 ENCOUNTER — Telehealth: Payer: Self-pay | Admitting: *Deleted

## 2020-01-14 NOTE — Telephone Encounter (Signed)
Staff message sent to Gae Bon ok to order HST no PA is required.

## 2020-01-14 NOTE — Telephone Encounter (Signed)
-----   Message from Freada Bergeron, [place] sent at 01/11/2020  7:08 PM EDT ----- Regarding: precert Home sleep test

## 2020-01-17 ENCOUNTER — Telehealth: Payer: Self-pay | Admitting: *Deleted

## 2020-01-17 NOTE — Telephone Encounter (Addendum)
Patient is aware and agreeable to Home Sleep Study through [facility]. Patient is scheduled for 6/11 at 7:30 to pick up home sleep kit and meet with Respiratory therapist at [facility]. Patient is aware that if this appointment date and time does not work for them they should contact [place] directly at [phone]. Patient is aware that a sleep packet will be sent from [facility] in week. Patient is agreeable to treatment and thankful for call.

## 2020-01-17 NOTE — Telephone Encounter (Signed)
-----   Message from Lauralee Evener, [place] sent at 01/14/2020  7:53 PM EDT ----- Regarding: RE: precert Ok to schedule HST. No PA is required. ----- Message ----- From: Freada Bergeron, CMA Sent: 01/11/2020   7:08 PM EDT To: [facility] Subject: precert                                        Home sleep test

## 2020-01-18 ENCOUNTER — Telehealth: Payer: Self-pay

## 2020-01-18 ENCOUNTER — Telehealth: Payer: Self-pay | Admitting: Internal Medicine

## 2020-01-18 NOTE — Telephone Encounter (Signed)
Per Dr. Rayann Heman, [employer] should not send alerts between 9:00 pm and 8:00 am.  Call placed to [employer] to reiterate.  Per Agricultural engineer, customer service rep this nurse spoke with last week was not authorized to make changes to Pt's chart.  Per preventice manager, per their protocols they have to send all events that qualify.  However they will stop calling Pt and calling office.  Pt is being evaluated for sleep apnea, which is believed to be cause of nighttime pauses.  Pt activated monitor at 4:00 am for dizziness.    Will make Dr. Rayann Heman aware.  Will continue to monitor.

## 2020-01-18 NOTE — Telephone Encounter (Signed)
Received a call from [employer] services regarding a 3.9 second pause while Mr. Dankers was sleeping this evening. His underlying rhythm was sinus arrhythmia and he remains in sinus arrhythmia now. The patient was unaware of any symptoms. Will update Dr. Bronson Ing on the results.  Alric Quan, MD Cardiology [facility]

## 2020-01-18 NOTE — Telephone Encounter (Signed)
[employer] called to state patient initiated recording at 4 am today with c/o dizziness. They noted a 6 second pause. They also want clarification about not sending alerts from 9 pm to 8 am.They believe it is to not call patient , but still send alerts.please call them at [phone]   I will fax strip to Dr.Allred

## 2020-01-18 NOTE — Telephone Encounter (Signed)
Error

## 2020-01-19 ENCOUNTER — Telehealth: Payer: Self-pay | Admitting: Internal Medicine

## 2020-01-19 ENCOUNTER — Other Ambulatory Visit: Payer: Self-pay

## 2020-01-19 NOTE — Telephone Encounter (Signed)
Received a call from [employer] services regarding a 3.5 second pause. Mr Corron did not answer their attempts to call. His underlying rhythm was sinus bradycardia with HR 55-60s. I advised that if they are able to contact Mr. Nuttall and he is symptomatic at the time of the pause, then he should come to the emergency department for evaluation. Will update Dr. Bronson Ing on the results.   Billey Chang, MD  Cardiology [facility]

## 2020-01-23 NOTE — Telephone Encounter (Signed)
Can we check in on the the patient about the pause, do we have the [employer] strips to review?   Ruben Abts MD

## 2020-01-26 ENCOUNTER — Ambulatory Visit: Payer: PPO | Attending: Nurse Practitioner | Admitting: Cardiology

## 2020-01-26 ENCOUNTER — Other Ambulatory Visit: Payer: Self-pay

## 2020-01-26 DIAGNOSIS — I499 Cardiac arrhythmia, unspecified: Secondary | ICD-10-CM | POA: Diagnosis not present

## 2020-01-26 DIAGNOSIS — R0902 Hypoxemia: Secondary | ICD-10-CM | POA: Diagnosis not present

## 2020-01-26 DIAGNOSIS — R0683 Snoring: Secondary | ICD-10-CM | POA: Insufficient documentation

## 2020-01-26 DIAGNOSIS — G4734 Idiopathic sleep related nonobstructive alveolar hypoventilation: Secondary | ICD-10-CM

## 2020-01-31 NOTE — Procedures (Signed)
  Patient Name: Ruben Lee, Ruben Lee Date: 01/26/2020 Gender: Male D.O.B: October 10, 1950 Age (years): 68 Referring Provider: Chanetta Marshall Height (inches): 36 Interpreting Physician: Fransico Him MD, ABSM Weight (lbs): 208 RPSGT: Rosebud Poles BMI: 26 MRN: [id number]  CLINICAL INFORMATION Sleep Study Type: HST  Indication for sleep study: N/A  Epworth Sleepiness Score: NA  SLEEP STUDY TECHNIQUE A multi-channel overnight portable sleep study was performed. The channels recorded were: nasal airflow, thoracic respiratory movement, and oxygen saturation with a pulse oximetry. Snoring was also monitored.  MEDICATIONS Patient self administered medications include: N/A.  SLEEP ARCHITECTURE Patient was studied for 528.9 minutes. The sleep efficiency was 98.0 % and the patient was supine for 99.8%. The arousal index was 0.0 per hour.  RESPIRATORY PARAMETERS The overall AHI was 1.5 per hour, with a central apnea index of 1.0 per hour.  The oxygen nadir was 83% during sleep.  CARDIAC DATA Mean heart rate during sleep was 64.2 bpm.  IMPRESSIONS - No significant obstructive sleep apnea occurred during this study (AHI = 1.5/h) but insufficient data was collected.  - No significant central sleep apnea occurred during this study (CAI = 1.0/h). - Moderate oxygen desaturation was noted during this study (Min O2 = 83%). - Patient snored 0.4% during the sleep.  DIAGNOSIS - Nocturnal Hypoxemia (327.26 [G47.36 ICD-10]) - Insufficient Sleep study  RECOMMENDATIONS - In lab PSG is recommended due to inaccurate data collected for the second half of study and nocturnal hypoxemia.  - Avoid alcohol, sedatives and other CNS depressants that may worsen sleep apnea and disrupt normal sleep architecture. - Sleep hygiene should be reviewed to assess factors that may improve sleep quality. - Weight management and regular exercise should be initiated or continued.  [Electronically signed] 01/31/2020  05:07 PM  Fransico Him MD, ABSM Diplomate, American Board of Sleep Medicine

## 2020-02-05 ENCOUNTER — Telehealth: Payer: Self-pay | Admitting: *Deleted

## 2020-02-05 NOTE — Telephone Encounter (Signed)
Informed patient of sleep study results and patient understanding was verbalized. Patient understands his sleep study showed Inadequate sleep study due to poor data for second half of study. Patient has hypoxemia and needs in lab PSG.  Pt is aware and agreeable to his results  PSG SENT TO SLEEP POOL.

## 2020-02-05 NOTE — Telephone Encounter (Signed)
-----   Message from Sueanne Margarita, MD sent at 01/31/2020  5:09 PM EDT ----- Inadequate sleep study due to poor data for second half of study.  Patient has hypoxemia and needs in lab PSG

## 2020-02-13 ENCOUNTER — Telehealth: Payer: Self-pay

## 2020-02-13 ENCOUNTER — Telehealth: Payer: Self-pay | Admitting: *Deleted

## 2020-02-13 NOTE — Telephone Encounter (Signed)
Ruben Blazer, LPN  8/37/2902 1:11 AM EDT Back to Top    Notified, copy to pcp.    Patsey Berthold, NP  02/13/2020 9:24 AM EDT     Follow up scheduled with Dr Rayann Heman 7/7 to review   Herminio Commons, MD  02/12/2020 12:52 PM EDT     Multiple episodes of very low HR's and pauses. Needs to follow up with Dr. Rayann Heman.

## 2020-02-13 NOTE — Telephone Encounter (Signed)
Pt is aware and agreeable to result findings and will discuss further at upcoming OV with Dr. Rayann Heman

## 2020-02-20 DIAGNOSIS — E782 Mixed hyperlipidemia: Secondary | ICD-10-CM | POA: Diagnosis not present

## 2020-02-20 DIAGNOSIS — E041 Nontoxic single thyroid nodule: Secondary | ICD-10-CM | POA: Diagnosis not present

## 2020-02-20 DIAGNOSIS — I10 Essential (primary) hypertension: Secondary | ICD-10-CM | POA: Diagnosis not present

## 2020-02-20 DIAGNOSIS — E1140 Type 2 diabetes mellitus with diabetic neuropathy, unspecified: Secondary | ICD-10-CM | POA: Diagnosis not present

## 2020-02-21 ENCOUNTER — Encounter: Payer: Self-pay | Admitting: *Deleted

## 2020-02-21 ENCOUNTER — Ambulatory Visit: Payer: PPO | Admitting: Internal Medicine

## 2020-02-21 ENCOUNTER — Encounter: Payer: Self-pay | Admitting: Internal Medicine

## 2020-02-21 ENCOUNTER — Other Ambulatory Visit: Payer: Self-pay

## 2020-02-21 VITALS — BP 174/82 | HR 75 | Ht 75.0 in | Wt 209.0 lb

## 2020-02-21 DIAGNOSIS — R42 Dizziness and giddiness: Secondary | ICD-10-CM

## 2020-02-21 DIAGNOSIS — R001 Bradycardia, unspecified: Secondary | ICD-10-CM | POA: Insufficient documentation

## 2020-02-21 DIAGNOSIS — I10 Essential (primary) hypertension: Secondary | ICD-10-CM | POA: Diagnosis not present

## 2020-02-21 NOTE — Patient Instructions (Addendum)
There were no vitals taken for this visit. Medication Instructions:  Your physician recommends that you continue on your current medications as directed. Please refer to the Current Medication list given to you today.  *If you need a refill on your cardiac medications before your next appointment, please call your pharmacy*  Lab Work: You will get lab work today: [facility]  If you have labs (blood work) drawn today and your tests are completely normal, you will receive your results only by: Marland Kitchen MyChart Message (if you have MyChart) OR . A paper copy in the mail If you have any lab test that is abnormal or we need to change your treatment, we will call you to review the results.  Testing/Procedures: None ordered.  Follow-Up:  See instruction letter.    Other Instructions:  Pacemaker Implantation, Adult Pacemaker implantation is a procedure to place a pacemaker inside your chest. A pacemaker is a small computer that sends electrical signals to the heart and helps your heart beat normally. A pacemaker also stores information about your heart rhythms. You may need pacemaker implantation if you:  Have a slow heartbeat (bradycardia).  Faint (syncope).  Have shortness of breath (dyspnea) due to heart problems. The pacemaker attaches to your heart through a wire, called a lead. Sometimes just one lead is needed. Other times, there will be two leads. There are two types of pacemakers:  Transvenous pacemaker. This type is placed under the skin or muscle of your chest. The lead goes through a vein in the chest area to reach the inside of the heart.  Epicardial pacemaker. This type is placed under the skin or muscle of your chest or belly. The lead goes through your chest to the outside of the heart. Tell a health care provider about:  Any allergies you have.  All medicines you are taking, including vitamins, herbs, eye drops, creams, and over-the-counter medicines.  Any problems you or  family members have had with anesthetic medicines.  Any blood or bone disorders you have.  Any surgeries you have had.  Any medical conditions you have.  Whether you are pregnant or may be pregnant. What are the risks? Generally, this is a safe procedure. However, problems may occur, including:  Infection.  Bleeding.  Failure of the pacemaker or the lead.  Collapse of a lung or bleeding into a lung.  Blood clot inside a blood vessel with a lead.  Damage to the heart.  Infection inside the heart (endocarditis).  Allergic reactions to medicines. What happens before the procedure? Staying hydrated Follow instructions from your health care provider about hydration, which may include:  Up to 2 hours before the procedure - you may continue to drink clear liquids, such as water, clear fruit juice, black coffee, and plain tea. Eating and drinking restrictions Follow instructions from your health care provider about eating and drinking, which may include:  8 hours before the procedure - stop eating heavy meals or foods such as meat, fried foods, or fatty foods.  6 hours before the procedure - stop eating light meals or foods, such as toast or cereal.  6 hours before the procedure - stop drinking milk or drinks that contain milk.  2 hours before the procedure - stop drinking clear liquids. Medicines  Ask your health care provider about: ? Changing or stopping your regular medicines. This is especially important if you are taking diabetes medicines or blood thinners. ? Taking medicines such as aspirin and ibuprofen. These medicines can thin  your blood. Do not take these medicines before your procedure if your health care provider instructs you not to.  You may be given antibiotic medicine to help prevent infection. General instructions  You will have a heart evaluation. This may include an electrocardiogram (ECG), chest X-ray, and heart imaging (echocardiogram,  or echo)  tests.  You will have blood tests.  Do not use any products that contain nicotine or tobacco, such as cigarettes and e-cigarettes. If you need help quitting, ask your health care provider.  Plan to have someone take you home from the hospital or clinic.  If you will be going home right after the procedure, plan to have someone with you for 24 hours.  Ask your health care provider how your surgical site will be marked or identified. What happens during the procedure?  To reduce your risk of infection: ? Your health care team will wash or sanitize their hands. ? Your skin will be washed with soap. ? Hair may be removed from the surgical area.  An IV tube will be inserted into one of your veins.  You will be given one or more of the following: ? A medicine to help you relax (sedative). ? A medicine to numb the area (local anesthetic). ? A medicine to make you fall asleep (general anesthetic).  If you are getting a transvenous pacemaker: ? An incision will be made in your upper chest. ? A pocket will be made for the pacemaker. It may be placed under the skin or between layers of muscle. ? The lead will be inserted into a blood vessel that returns to the heart. ? While X-rays are taken by an imaging machine (fluoroscopy), the lead will be advanced through the vein to the inside of your heart. ? The other end of the lead will be tunneled under the skin and attached to the pacemaker.  If you are getting an epicardial pacemaker: ? An incision will be made near your ribs or breastbone (sternum) for the lead. ? The lead will be attached to the outside of your heart. ? Another incision will be made in your chest or upper belly to create a pocket for the pacemaker. ? The free end of the lead will be tunneled under the skin and attached to the pacemaker.  The transvenous or epicardial pacemaker will be tested. Imaging studies may be done to check the lead position.  The incisions will be  closed with stitches (sutures), adhesive strips, or skin glue.  Bandages (dressing) will be placed over the incisions. The procedure may vary among health care providers and hospitals. What happens after the procedure?  Your blood pressure, heart rate, breathing rate, and blood oxygen level will be monitored until the medicines you were given have worn off.  You will be given antibiotics and pain medicine.  ECG and chest x-rays will be done.  You will wear a continuous type of ECG (Holter monitor) to check your heart rhythm.  Your health care provider will program the pacemaker.  Do not drive for 24 hours if you received a sedative. This information is not intended to replace advice given to you by your health care provider. Make sure you discuss any questions you have with your health care provider. Document Revised: 04/22/2018 Document Reviewed: 01/15/2016 Elsevier Patient Education  [employer].

## 2020-02-21 NOTE — H&P (View-Only) (Signed)
PCP: Celene Squibb, MD Primary Cardiologist: previously Dr Bronson Ing Primary EP: Dr Wilburt Finlay is a 69 y.o. male who presents today for routine electrophysiology followup.  Since last being seen in our clinic, the patient reports doing very well.  He has been documented to have frequent sinus pauses.  He reports symptoms of dizziness and presyncope.  Today, he denies symptoms of palpitations, chest pain, shortness of breath,  lower extremity edema, or syncope.  The patient is otherwise without complaint today.   Past Medical History:  Diagnosis Date   Hyperlipidemia    Hypertension    Past Surgical History:  Procedure Laterality Date   COLONOSCOPY N/A 09/16/2015   Procedure: COLONOSCOPY;  Surgeon: Danie Binder, MD;  [facility];  Service: Endoscopy;  Laterality: N/A;  1230pm   COLONOSCOPY N/A 02/10/2019   Procedure: COLONOSCOPY;  Surgeon: Danie Binder, MD;  [facility]: [facility];  Service: Endoscopy;  Laterality: N/A;  2:00   KNEE ARTHROSCOPY WITH MEDIAL MENISECTOMY Right 03/11/2016   Procedure: KNEE ARTHROSCOPY WITH MEDIAL MENISECTOMY;  Surgeon: Carole Civil, MD;  Location: [facility];  Service: Orthopedics;  Laterality: Right;   None to date     As of 08/29/15   POLYPECTOMY  02/10/2019   Procedure: POLYPECTOMY;  Surgeon: Danie Binder, MD;  Location: [facility];  Service: Endoscopy;;  colon    ROS- all systems are reviewed and negatives except as per HPI above  Current Outpatient Medications  Medication Sig Dispense Refill   aspirin 81 MG tablet Take 81 mg by mouth daily.     Cetirizine HCl 10 MG TBDP      lovastatin (MEVACOR) 10 MG tablet Take 10 mg by mouth at bedtime.   0   Multiple Vitamin (MULTIVITAMIN) capsule Take 1 capsule by mouth daily.     olmesartan (BENICAR) 20 MG tablet Take 20 mg by mouth daily.     No current facility-administered medications for this visit.    Physical Exam: Vitals:   02/21/20 1427  BP: (!)  174/82  Pulse: 75  SpO2: 96%  Weight: 209 lb (94.8 kg)  Height: 6\' 3"  (1.905 m)    GEN- The patient is well appearing, alert and oriented x 3 today.   Head- normocephalic, atraumatic Eyes-  Sclera clear, conjunctiva pink Ears- hearing intact Oropharynx- clear Lungs-  normal work of breathing Heart- Regular rate and rhythm  GI- soft  Extremities- no clubbing, cyanosis, or edema  Wt Readings from Last 3 Encounters:  02/21/20 209 lb (94.8 kg)  01/08/20 208 lb (94.3 kg)  12/20/19 216 lb (98 kg)    EKG tracing ordered today is personally reviewed and shows sinus with first degree AV block  Assessment and Plan:  1. Sick sinus syndrome He has multiple sinus pauses both during the day and at night.  He has symptoms of dizziness/ presyncope. The patient has symptomatic bradycardia.  I would therefore recommend pacemaker implantation at this time.  Risks, benefits, alternatives to pacemaker implantation were discussed in detail with the patient today. The patient understands that the risks include but are not limited to bleeding, infection, pneumothorax, perforation, tamponade, vascular damage, renal failure, MI, stroke, death,  and lead dislodgement and wishes to proceed. We will therefore schedule the procedure at the next available time.    No driving until after PPM implant.  2. HTN Stable No change required today   Risks, benefits and potential toxicities for medications prescribed and/or  refilled reviewed with patient today.   Thompson Grayer MD, [facility] 02/21/2020 2:37 PM

## 2020-02-21 NOTE — Progress Notes (Signed)
PCP: Celene Squibb, MD Primary Cardiologist: previously Dr Bronson Ing Primary EP: Dr Wilburt Finlay is a 69 y.o. male who presents today for routine electrophysiology followup.  Since last being seen in our clinic, the patient reports doing very well.  He has been documented to have frequent sinus pauses.  He reports symptoms of dizziness and presyncope.  Today, he denies symptoms of palpitations, chest pain, shortness of breath,  lower extremity edema, or syncope.  The patient is otherwise without complaint today.   Past Medical History:  Diagnosis Date  . Hyperlipidemia   . Hypertension    Past Surgical History:  Procedure Laterality Date  . COLONOSCOPY N/A 09/16/2015   Procedure: COLONOSCOPY;  Surgeon: Danie Binder, MD;  [facility];  Service: Endoscopy;  Laterality: N/A;  1230pm  . COLONOSCOPY N/A 02/10/2019   Procedure: COLONOSCOPY;  Surgeon: Danie Binder, MD;  [facility];  Service: Endoscopy;  Laterality: N/A;  2:00  . KNEE ARTHROSCOPY WITH MEDIAL MENISECTOMY Right 03/11/2016   Procedure: KNEE ARTHROSCOPY WITH MEDIAL MENISECTOMY;  Surgeon: Carole Civil, MD;  Location: [facility];  Service: Orthopedics;  Laterality: Right;  . None to date     As of 08/29/15  . POLYPECTOMY  02/10/2019   Procedure: POLYPECTOMY;  Surgeon: Danie Binder, MD;  [facility];  Service: Endoscopy;;  colon    ROS- all systems are reviewed and negatives except as per HPI above  Current Outpatient Medications  Medication Sig Dispense Refill  . aspirin 81 MG tablet Take 81 mg by mouth daily.    . Cetirizine HCl 10 MG TBDP     . lovastatin (MEVACOR) 10 MG tablet Take 10 mg by mouth at bedtime.   0  . Multiple Vitamin (MULTIVITAMIN) capsule Take 1 capsule by mouth daily.    Marland Kitchen olmesartan (BENICAR) 20 MG tablet Take 20 mg by mouth daily.     No current facility-administered medications for this visit.    Physical Exam: Vitals:   02/21/20 1427  BP: (!)  174/82  Pulse: 75  SpO2: 96%  Weight: 209 lb (94.8 kg)  Height: 6\' 3"  (1.905 m)    GEN- The patient is well appearing, alert and oriented x 3 today.   Head- normocephalic, atraumatic Eyes-  Sclera clear, conjunctiva pink Ears- hearing intact Oropharynx- clear Lungs-  normal work of breathing Heart- Regular rate and rhythm  GI- soft  Extremities- no clubbing, cyanosis, or edema  Wt Readings from Last 3 Encounters:  02/21/20 209 lb (94.8 kg)  01/08/20 208 lb (94.3 kg)  12/20/19 216 lb (98 kg)    EKG tracing ordered today is personally reviewed and shows sinus with first degree AV block  Assessment and Plan:  1. Sick sinus syndrome He has multiple sinus pauses both during the day and at night.  He has symptoms of dizziness/ presyncope. The patient has symptomatic bradycardia.  I would therefore recommend pacemaker implantation at this time.  Risks, benefits, alternatives to pacemaker implantation were discussed in detail with the patient today. The patient understands that the risks include but are not limited to bleeding, infection, pneumothorax, perforation, tamponade, vascular damage, renal failure, MI, stroke, death,  and lead dislodgement and wishes to proceed. We will therefore schedule the procedure at the next available time.    No driving until after PPM implant.  2. HTN Stable No change required today   Risks, benefits and potential toxicities for medications prescribed and/or  refilled reviewed with patient today.   Thompson Grayer MD, [facility] 02/21/2020 2:37 PM

## 2020-02-22 LAB — CBC WITH DIFFERENTIAL/PLATELET
Basophils Absolute: 0 10*3/uL (ref 0.0–0.2)
Basos: 1 %
EOS (ABSOLUTE): 0.1 10*3/uL (ref 0.0–0.4)
Eos: 2 %
Hematocrit: 42.8 % (ref 37.5–51.0)
Hemoglobin: 14.8 g/dL (ref 13.0–17.7)
Immature Grans (Abs): 0 10*3/uL (ref 0.0–0.1)
Immature Granulocytes: 0 %
Lymphocytes Absolute: 1.2 10*3/uL (ref 0.7–3.1)
Lymphs: 23 %
MCH: 33.4 pg — ABNORMAL HIGH (ref 26.6–33.0)
MCHC: 34.6 g/dL (ref 31.5–35.7)
MCV: 97 fL (ref 79–97)
Monocytes Absolute: 0.6 10*3/uL (ref 0.1–0.9)
Monocytes: 11 %
Neutrophils Absolute: 3.4 10*3/uL (ref 1.4–7.0)
Neutrophils: 63 %
Platelets: 167 10*3/uL (ref 150–450)
RBC: 4.43 x10E6/uL (ref 4.14–5.80)
RDW: 12.3 % (ref 11.6–15.4)
WBC: 5.4 10*3/uL (ref 3.4–10.8)

## 2020-02-22 LAB — BASIC METABOLIC PANEL
BUN/Creatinine Ratio: 27 — ABNORMAL HIGH (ref 10–24)
BUN: 21 mg/dL (ref 8–27)
CO2: 23 mmol/L (ref 20–29)
Calcium: 9.6 mg/dL (ref 8.6–10.2)
Chloride: 103 mmol/L (ref 96–106)
Creatinine, Ser: 0.79 mg/dL (ref 0.76–1.27)
GFR calc Af Amer: 107 mL/min/{1.73_m2} (ref >59)
GFR calc non Af Amer: 92 mL/min/{1.73_m2} (ref >59)
Glucose: 100 mg/dL — ABNORMAL HIGH (ref 65–99)
Potassium: 3.9 mmol/L (ref 3.5–5.2)
Sodium: 141 mmol/L (ref 134–144)

## 2020-03-08 DIAGNOSIS — E1169 Type 2 diabetes mellitus with other specified complication: Secondary | ICD-10-CM | POA: Diagnosis not present

## 2020-03-08 DIAGNOSIS — I10 Essential (primary) hypertension: Secondary | ICD-10-CM | POA: Diagnosis not present

## 2020-03-08 DIAGNOSIS — E782 Mixed hyperlipidemia: Secondary | ICD-10-CM | POA: Diagnosis not present

## 2020-03-08 DIAGNOSIS — E041 Nontoxic single thyroid nodule: Secondary | ICD-10-CM | POA: Diagnosis not present

## 2020-03-08 DIAGNOSIS — E119 Type 2 diabetes mellitus without complications: Secondary | ICD-10-CM | POA: Diagnosis not present

## 2020-03-08 DIAGNOSIS — R7301 Impaired fasting glucose: Secondary | ICD-10-CM | POA: Diagnosis not present

## 2020-03-08 DIAGNOSIS — E7849 Other hyperlipidemia: Secondary | ICD-10-CM | POA: Diagnosis not present

## 2020-03-08 DIAGNOSIS — E1140 Type 2 diabetes mellitus with diabetic neuropathy, unspecified: Secondary | ICD-10-CM | POA: Diagnosis not present

## 2020-03-13 DIAGNOSIS — M25561 Pain in right knee: Secondary | ICD-10-CM | POA: Diagnosis not present

## 2020-03-13 DIAGNOSIS — E782 Mixed hyperlipidemia: Secondary | ICD-10-CM | POA: Diagnosis not present

## 2020-03-13 DIAGNOSIS — E041 Nontoxic single thyroid nodule: Secondary | ICD-10-CM | POA: Diagnosis not present

## 2020-03-13 DIAGNOSIS — E1140 Type 2 diabetes mellitus with diabetic neuropathy, unspecified: Secondary | ICD-10-CM | POA: Diagnosis not present

## 2020-03-13 DIAGNOSIS — I495 Sick sinus syndrome: Secondary | ICD-10-CM | POA: Diagnosis not present

## 2020-03-13 DIAGNOSIS — I10 Essential (primary) hypertension: Secondary | ICD-10-CM | POA: Diagnosis not present

## 2020-03-19 ENCOUNTER — Ambulatory Visit (HOSPITAL_COMMUNITY): Payer: PPO

## 2020-03-19 ENCOUNTER — Ambulatory Visit (HOSPITAL_COMMUNITY)
Admission: RE | Disposition: A | Discharge status: Home / Self Care | Payer: Self-pay | Source: Home / Self Care | Attending: Internal Medicine

## 2020-03-19 ENCOUNTER — Ambulatory Visit (HOSPITAL_COMMUNITY)
Admission: RE | Admit: 2020-03-19 | Discharge: 2020-03-19 | Disposition: A | Discharge status: Home / Self Care | Payer: PPO | Attending: Internal Medicine | Admitting: Internal Medicine

## 2020-03-19 ENCOUNTER — Other Ambulatory Visit: Payer: Self-pay

## 2020-03-19 DIAGNOSIS — I495 Sick sinus syndrome: Secondary | ICD-10-CM | POA: Diagnosis not present

## 2020-03-19 DIAGNOSIS — Z79899 Other long term (current) drug therapy: Secondary | ICD-10-CM | POA: Diagnosis not present

## 2020-03-19 DIAGNOSIS — Z950 Presence of cardiac pacemaker: Secondary | ICD-10-CM | POA: Diagnosis not present

## 2020-03-19 DIAGNOSIS — Z7982 Long term (current) use of aspirin: Secondary | ICD-10-CM | POA: Insufficient documentation

## 2020-03-19 DIAGNOSIS — E785 Hyperlipidemia, unspecified: Secondary | ICD-10-CM | POA: Insufficient documentation

## 2020-03-19 DIAGNOSIS — I10 Essential (primary) hypertension: Secondary | ICD-10-CM | POA: Insufficient documentation

## 2020-03-19 HISTORY — PX: PACEMAKER IMPLANT: EP1218

## 2020-03-19 SURGERY — PACEMAKER IMPLANT

## 2020-03-19 MED ORDER — SODIUM CHLORIDE 0.9 % IV SOLN: 250.0000 mL | INTRAVENOUS | Status: DC | PRN

## 2020-03-19 MED ORDER — LIDOCAINE HCL 1 % IJ SOLN
INTRAMUSCULAR | Status: AC
  Filled 2020-03-19: qty 60

## 2020-03-19 MED ORDER — FENTANYL CITRATE (PF) 100 MCG/2ML IJ SOLN
INTRAMUSCULAR | Status: DC | PRN
  Administered 2020-03-19: 25 ug via INTRAVENOUS

## 2020-03-19 MED ORDER — SODIUM CHLORIDE 0.9% FLUSH: 3.0000 mL | Freq: Two times a day (BID) | INTRAVENOUS | Status: DC

## 2020-03-19 MED ORDER — LIDOCAINE HCL (PF) 1 % IJ SOLN
INTRAMUSCULAR | Status: DC | PRN
  Administered 2020-03-19: 20 mL
  Administered 2020-03-19: 60 mL
  Administered 2020-03-19: 20 mL

## 2020-03-19 MED ORDER — IOHEXOL 350 MG/ML SOLN
INTRAVENOUS | Status: DC | PRN
  Administered 2020-03-19: 15 mL

## 2020-03-19 MED ORDER — CEFAZOLIN SODIUM-DEXTROSE 2-4 GM/100ML-% IV SOLN
2.0000 g | INTRAVENOUS | Status: AC
  Administered 2020-03-19: 2 g via INTRAVENOUS
  Filled 2020-03-19: qty 100

## 2020-03-19 MED ORDER — CEFAZOLIN SODIUM-DEXTROSE 2-4 GM/100ML-% IV SOLN
INTRAVENOUS | Status: AC
  Filled 2020-03-19: qty 100

## 2020-03-19 MED ORDER — SODIUM CHLORIDE 0.9 % IV SOLN
80.0000 mg | INTRAVENOUS | Status: AC
  Administered 2020-03-19: 80 mg
  Filled 2020-03-19: qty 2

## 2020-03-19 MED ORDER — HEPARIN (PORCINE) IN NACL 1000-0.9 UT/500ML-% IV SOLN
INTRAVENOUS | Status: AC
  Filled 2020-03-19: qty 500

## 2020-03-19 MED ORDER — SODIUM CHLORIDE 0.9% FLUSH: 3.0000 mL | INTRAVENOUS | Status: DC | PRN

## 2020-03-19 MED ORDER — LIDOCAINE HCL 1 % IJ SOLN
INTRAMUSCULAR | Status: AC
  Filled 2020-03-19: qty 20

## 2020-03-19 MED ORDER — ACETAMINOPHEN 325 MG PO TABS
ORAL_TABLET | ORAL | Status: AC
  Filled 2020-03-19: qty 2

## 2020-03-19 MED ORDER — MIDAZOLAM HCL 5 MG/5ML IJ SOLN
INTRAMUSCULAR | Status: DC | PRN
  Administered 2020-03-19: 2 mg via INTRAVENOUS
  Administered 2020-03-19 (×2): 1 mg via INTRAVENOUS

## 2020-03-19 MED ORDER — ACETAMINOPHEN 325 MG PO TABS
325.0000 mg | ORAL_TABLET | ORAL | Status: DC | PRN
Start: 2020-03-19 — End: 2020-03-20
  Administered 2020-03-19: 650 mg via ORAL

## 2020-03-19 MED ORDER — CEFAZOLIN SODIUM-DEXTROSE 2-3 GM-%(50ML) IV SOLR
INTRAVENOUS | Status: AC | PRN
  Administered 2020-03-19: 2 g via INTRAVENOUS

## 2020-03-19 MED ORDER — MIDAZOLAM HCL 5 MG/5ML IJ SOLN
INTRAMUSCULAR | Status: AC
  Filled 2020-03-19: qty 5

## 2020-03-19 MED ORDER — SODIUM CHLORIDE 0.9 % IV SOLN
INTRAVENOUS | Status: AC
  Filled 2020-03-19: qty 2

## 2020-03-19 MED ORDER — CHLORHEXIDINE GLUCONATE 4 % EX LIQD
4.0000 "application " | Freq: Once | CUTANEOUS | Status: DC
  Filled 2020-03-19: qty 60

## 2020-03-19 MED ORDER — HEPARIN (PORCINE) IN NACL 1000-0.9 UT/500ML-% IV SOLN
INTRAVENOUS | Status: DC | PRN
  Administered 2020-03-19: 500 mL

## 2020-03-19 MED ORDER — FENTANYL CITRATE (PF) 100 MCG/2ML IJ SOLN
INTRAMUSCULAR | Status: AC
  Filled 2020-03-19: qty 2

## 2020-03-19 MED ORDER — ONDANSETRON HCL 4 MG/2ML IJ SOLN: 4.0000 mg | Freq: Four times a day (QID) | INTRAMUSCULAR | Status: DC | PRN

## 2020-03-19 MED ORDER — SODIUM CHLORIDE 0.9 % IV SOLN: INTRAVENOUS | Status: DC

## 2020-03-19 SURGICAL SUPPLY — 7 items
CABLE SURGICAL S-101-97-12 (CABLE) ×3 IMPLANT
LEAD TENDRIL MRI 52CM LPA1200M (Lead) ×2 IMPLANT
LEAD TENDRIL MRI 58CM LPA1200M (Lead) ×2 IMPLANT
PACEMAKER ASSURITY DR-RF (Pacemaker) ×2 IMPLANT
PAD PRO RADIOLUCENT 2001M-C (PAD) ×3 IMPLANT
SHEATH 8FR PRELUDE SNAP 13 (SHEATH) ×4 IMPLANT
TRAY PACEMAKER INSERTION (PACKS) ×3 IMPLANT

## 2020-03-19 NOTE — Discharge Instructions (Signed)
° ° °  Supplemental Discharge Instructions for  Pacemaker/Defibrillator Patients  Tomorrow, 03/20/2020, PLEASE SEND A REMOTE DEVICE TRANSMISSION    Activity No heavy lifting or vigorous activity with your left/right arm for 6 to 8 weeks.  Do not raise your left/right arm above your head for one week.  Gradually raise your affected arm as drawn below.             03/23/2020                   03/24/2020                  03/25/2020                  03/26/2020 __  NO DRIVING for  1 week   ; you may begin driving on  1/61/0960   .  WOUND CARE - Keep the wound area clean and dry.  Do not get this area wet, no showers until cleared to at your wound check visit - Tomorrow, 03/20/2020, remove the arm sling - Tomorrow, 03/20/2020, remove the outer plastic bandage.  Underneath there are steri strips coveringthe wound.  DO NOT remove these - The tape/steri-strips on your wound will fall off; do not pull them off.  No bandage is needed on the site.  DO  NOT apply any creams, oils, or ointments to the wound area. - If you notice any drainage or discharge from the wound, any swelling or bruising at the site, or you develop a fever > 101? F after you are discharged home, call the office at once.  Special Instructions - You are still able to use cellular telephones; use the ear opposite the side where you have your pacemaker/defibrillator.  Avoid carrying your cellular phone near your device. - When traveling through airports, show security personnel your identification card to avoid being screened in the metal detectors.  Ask the security personnel to use the hand wand. - Avoid arc welding equipment, MRI testing (magnetic resonance imaging), TENS units (transcutaneous nerve stimulators).  Call the office for questions about other devices. - Avoid electrical appliances that are in poor condition or are not properly grounded. - Microwave ovens are safe to be near or to operate.

## 2020-03-19 NOTE — Progress Notes (Signed)
Per Dr Rayann Heman , pt to dc at [facility]

## 2020-03-19 NOTE — Interval H&P Note (Signed)
History and Physical Interval Note:  03/19/2020 2:56 PM  Ruben Lee  has presented today for surgery, with the diagnosis of bradicardia.  The various methods of treatment have been discussed with the patient and family. After consideration of risks, benefits and other options for treatment, the patient has consented to  Procedure(s): PACEMAKER IMPLANT (N/A) as a surgical intervention.  The patient's history has been reviewed, patient examined, no change in status, stable for surgery.  I have reviewed the patient's chart and labs.  Questions were answered to the patient's satisfaction.    Risks, benefits, alternatives to pacemaker implantation were discussed in detail with the patient again today. The patient understands that the risks include but are not limited to bleeding, infection, pneumothorax, perforation, tamponade, vascular damage, renal failure, MI, stroke, death,  and lead dislodgement and wishes to proceed.    Thompson Grayer

## 2020-03-20 ENCOUNTER — Encounter (HOSPITAL_COMMUNITY): Payer: Self-pay | Admitting: Internal Medicine

## 2020-03-21 ENCOUNTER — Other Ambulatory Visit: Payer: Self-pay

## 2020-03-21 ENCOUNTER — Ambulatory Visit: Payer: PPO | Admitting: Student

## 2020-03-21 ENCOUNTER — Encounter: Payer: Self-pay | Admitting: Student

## 2020-03-21 VITALS — BP 142/72 | HR 84 | Ht 75.0 in | Wt 215.0 lb

## 2020-03-21 DIAGNOSIS — R9389 Abnormal findings on diagnostic imaging of other specified body structures: Secondary | ICD-10-CM

## 2020-03-21 DIAGNOSIS — I495 Sick sinus syndrome: Secondary | ICD-10-CM | POA: Diagnosis not present

## 2020-03-21 DIAGNOSIS — I10 Essential (primary) hypertension: Secondary | ICD-10-CM

## 2020-03-21 MED FILL — Lidocaine HCl Local Inj 1%: INTRAMUSCULAR | Qty: 100 | Status: AC

## 2020-03-21 NOTE — Progress Notes (Signed)
Cardiology Office Note    Date:  03/21/2020   ID:  Ruben Lee, DOB Aug 08, 1951, MRN [id number]  PCP:  Celene Squibb, MD  EP: Dr. Rayann Heman  Chief Complaint  Patient presents with  . Follow-up    3 month visit; Recent PPM placement    History of Present Illness:    Ruben Lee is a 69 y.o. male with past medical history of HTN and HLD who presents to the office today for 22-month follow-up.  He was last examined by Dr. Bronson Ing in 12/2019 as a new patient referral for episodes of dizziness and lightheadedness and had been able to capture his heart rate with this and it would drop into the 20's at times. An echocardiogram along with event monitor were recommended for further evaluation. His event monitor showed multiple sinus pauses occurring both while asleep and during the awake hours, therefore he was evaluated by Dr. Rayann Heman and PPM placement was recommended.  He underwent PPM placement on 03/19/2020 with placement of a St. Jude Medical Assurity MRI PPM.  CXR showed no evidence of pneumothorax but he was noted to have a rounded density overlying the posterior eighth rib on the right with outpatient 2 view CXR recommended in 4 to 6 weeks.   In talking with the patient today, he reports that he never experienced dizziness or presyncope prior to PPM placement but says he would develop a flushed sensation along his face and weakness along his extremities. He is hopeful these episodes will resolve following PPM placement. He reports having significant pain along his site the night after his procedure but says this has improved with the use of Tylenol. No drainage along the site. He is anxious to resume his normal activities as he was previously using the elliptical twice daily along with going for a brisk walk.  He denies any recent chest pain or dyspnea on exertion.  No recent orthopnea, PND, lower extremity edema or palpitations.  Past Medical History:  Diagnosis Date  . Hyperlipidemia     . Hypertension   . Sick sinus syndrome [facility])    a. s/p St. Jude PPM placement in 03/2020    Past Surgical History:  Procedure Laterality Date  . COLONOSCOPY N/A 09/16/2015   Procedure: COLONOSCOPY;  Surgeon: Danie Binder, MD;  [facility];  Service: Endoscopy;  Laterality: N/A;  1230pm  . COLONOSCOPY N/A 02/10/2019   Procedure: COLONOSCOPY;  Surgeon: Danie Binder, MD;  [facility];  Service: Endoscopy;  Laterality: N/A;  2:00  . KNEE ARTHROSCOPY WITH MEDIAL MENISECTOMY Right 03/11/2016   Procedure: KNEE ARTHROSCOPY WITH MEDIAL MENISECTOMY;  Surgeon: Carole Civil, MD;  Location: [facility] ORS;  Service: Orthopedics;  Laterality: Right;  . None to date     As of 08/29/15  . PACEMAKER IMPLANT N/A 03/19/2020   Procedure: PACEMAKER IMPLANT;  Surgeon: Thompson Grayer, MD;  Location: [place] CV LAB;  Service: Cardiovascular;  Laterality: N/A;  . POLYPECTOMY  02/10/2019   Procedure: POLYPECTOMY;  Surgeon: Danie Binder, MD;  [facility];  Service: Endoscopy;;  colon    Current Medications: Outpatient Medications Prior to Visit  Medication Sig Dispense Refill  . aspirin 81 MG tablet Take 81 mg by mouth daily.    . cetirizine (ZYRTEC) 10 MG tablet Take 10 mg by mouth daily.     Marland Kitchen lovastatin (MEVACOR) 10 MG tablet Take 10 mg by mouth at bedtime.   0  .  Multiple Vitamin (MULTIVITAMIN) capsule Take 1 capsule by mouth daily.    Marland Kitchen olmesartan (BENICAR) 20 MG tablet Take 20 mg by mouth daily.     No facility-administered medications prior to visit.     Allergies:   Patient has no known allergies.   Social History   Socioeconomic History  . Marital status: Widowed    Spouse name: Not on file  . Number of children: Not on file  . Years of education: Not on file  . Highest education level: Not on file  Occupational History  . Not on file  Tobacco Use  . Smoking status: Former Smoker    Packs/day: 1.00    Years: 43.00    Pack years: 43.00    Types:  Cigarettes    Quit date: 07/18/2010    Years since quitting: 9.6  . Smokeless tobacco: Never Used  Vaping Use  . Vaping Use: Never used  Substance and Sexual Activity  . Alcohol use: No    Alcohol/week: 0.0 standard drinks    Comment: None in 20 years  . Drug use: No  . Sexual activity: Not on file  Other Topics Concern  . Not on file  Social History Narrative  . Not on file   Social Determinants of Health   Financial Resource Strain:   . Difficulty of Paying Living Expenses:   Food Insecurity:   . Worried About Charity fundraiser in the Last Year:   . Arboriculturist in the Last Year:   Transportation Needs:   . Film/video editor (Medical):   Marland Kitchen Lack of Transportation (Non-Medical):   Physical Activity:   . Days of Exercise per Week:   . Minutes of Exercise per Session:   Stress:   . Feeling of Stress :   Social Connections:   . Frequency of Communication with Friends and Family:   . Frequency of Social Gatherings with Friends and Family:   . Attends Religious Services:   . Active Member of Clubs or Organizations:   . Attends Archivist Meetings:   Marland Kitchen Marital Status:      Family History:  The patient's family history includes COPD in his mother; Colon cancer in his cousin; Heart attack in his father.   Review of Systems:   Please see the history of present illness.     General:  No chills, fever, night sweats or weight changes.  Cardiovascular:  No exertional chest pain, dyspnea on exertion, edema, orthopnea, palpitations, paroxysmal nocturnal dyspnea. Positive for discomfort along PPM site (now resolved).  Dermatological: No rash, lesions/masses Respiratory: No cough, dyspnea Urologic: No hematuria, dysuria Abdominal:   No nausea, vomiting, diarrhea, bright red blood per rectum, melena, or hematemesis Neurologic:  No visual changes, wkns, changes in mental status. All other systems reviewed and are otherwise negative except as noted  above.   Physical Exam:    VS:  BP (!) 142/72   Pulse 84   Ht 6\' 3"  (1.905 m)   Wt 215 lb (97.5 kg)   BMI 26.87 kg/m    General: Well developed, well nourished,male appearing in no acute distress. Head: Normocephalic, atraumatic. Neck: No carotid bruits. JVD not elevated.  Lungs: Respirations regular and unlabored, without wheezes or rales.  Heart: Regular rate and rhythm. No S3 or S4.  No murmur, no rubs, or gallops appreciated. PPM site without erythema or drainage. Steri-strips in place. Abdomen: Appears non-distended. No obvious abdominal masses. Msk:  Strength and tone appear  normal for age. No obvious joint deformities or effusions. Extremities: No clubbing or cyanosis. No edema.  Distal pedal pulses are 2+ bilaterally. Neuro: Alert and oriented X 3. Moves all extremities spontaneously. No focal deficits noted. Psych:  Responds to questions appropriately with a normal affect. Skin: No rashes or lesions noted  Wt Readings from Last 3 Encounters:  03/21/20 215 lb (97.5 kg)  03/19/20 208 lb (94.3 kg)  02/21/20 209 lb (94.8 kg)     Studies/Labs Reviewed:    EKG:  EKG is not ordered today. EKG from 03/19/2020 is reviewed which shows NSR, HR 74 with 1st degree AV block.   Recent Labs: 02/21/2020: BUN 21; Creatinine, Ser 0.79; Hemoglobin 14.8; Platelets 167; Potassium 3.9; Sodium 141   Lipid Panel No results found for: CHOL, TRIG, HDL, CHOLHDL, VLDL, LDLCALC, LDLDIRECT  Additional studies/ records that were reviewed today include:   Echocardiogram: 12/2019 IMPRESSIONS    1. Left ventricular ejection fraction, by estimation, is 60 to 65%. The  left ventricle has normal function. The left ventricle has no regional  wall motion abnormalities. Left ventricular diastolic parameters were  normal.  2. Right ventricular systolic function is normal. The right ventricular  size is normal.  3. The mitral valve is normal in structure. No evidence of mitral valve  regurgitation.  No evidence of mitral stenosis.  4. The aortic valve is tricuspid. Aortic valve regurgitation is not  visualized. No aortic stenosis is present.  5. The inferior vena cava is normal in size with greater than 50%  respiratory variability, suggesting right atrial pressure of 3 mmHg.   Event Monitor: 12/2019  Sinus bradycardia (HR's low as 20 bpm range during awake hours, auto-triggered), sinus arrhythmia, 1st degree AV block, and multiple significant pauses (occurring during both asleep and awake hours), PAC's, with sinus rhythm and sinus tachycardia also seen.   Assessment:    1. Sinus node dysfunction (HCC)   2. Abnormal CXR   3. Essential hypertension      Plan:   In order of problems listed above:  1. Sinus Node Dysfunction - His recent event monitor showed multiple sinus pauses and he was evaluated by EP. Underwent PPM placement by Dr. Rayann Heman on 03/19/2020 with placement of a St. Jude Medical Assurity MRI PPM. He did have pain the evening following the procedure but says this has improved. No evidence of erythema or drainage on examination today. Steri-Strips were not removed today as he has an appointment for a wound check on 04/02/2020. - Given his nocturnal pauses, he did undergo a home sleep study in 01/2020 which was inadequate and the plan is for him to have a sleep lab study in the future. Being followed by Dr. Radford Pax.    2. Abnormal CXR - Recent CXR showed no evidence of a pneumothorax but he was noted to have a rounded density overlying the posterior eighth rib on the right with outpatient 2 view CXR recommended in 4 to 6 weeks.  He prefers to have this performed in [place] and the order was entered today with plans for repeat imaging in 4 weeks.  3. HTN - BP was significantly elevated at 181/83 on initial check, improved to 142/72 on repeat.  He does report blood pressure has been well controlled at home and is typically elevated when at a provider's office. - He was  encouraged to continue to follow BP in the ambulatory setting. Continue Benicar 20 mg daily.  Medication Adjustments/Labs and Tests Ordered: Current medicines are  reviewed at length with the patient today.  Concerns regarding medicines are outlined above.  Medication changes, Labs and Tests ordered today are listed in the Patient Instructions below. Patient Instructions  Medication Instructions:  Your physician recommends that you continue on your current medications as directed. Please refer to the Current Medication list given to you today.  *If you need a refill on your cardiac medications before your next appointment, please call your pharmacy*   Lab Work: NONE TODAY If you have labs (blood work) drawn today and your tests are completely normal, you will receive your results only by: Marland Kitchen MyChart Message (if you have MyChart) OR . A paper copy in the mail If you have any lab test that is abnormal or we need to change your treatment, we will call you to review the results.   Testing/Procedures: REPEAT CHEST XRAY IN 1 MONTH (04/21/20) AT [place]. NO APPOINTMENT NEEDED.   Follow-Up: At [facility], you and your health needs are our priority.  As part of our continuing mission to provide you with exceptional heart care, we have created designated Provider Care Teams.  These Care Teams include your primary Cardiologist (physician) and Advanced Practice Providers (APPs -  Physician Assistants and Nurse Practitioners) who all work together to provide you with the care you need, when you need it.  We recommend signing up for the patient portal called "MyChart".  Sign up information is provided on this After Visit Summary.  MyChart is used to connect with patients for Virtual Visits (Telemedicine).  Patients are able to view lab/test results, encounter notes, upcoming appointments, etc.  Non-urgent messages can be sent to your provider as well.   To learn more about what you  can do with MyChart, go to [email or web address].    Your next appointment:   3 month(s)  The format for your next appointment:   In Person  Provider:   Thompson Grayer, MD   Other Instructions NONE      Thank you for choosing [facility] !            Signed, Erma Heritage, PA-C  03/21/2020 5:57 PM    [place] [address] [place], [place] [address] Phone: [phone] Fax: [phone]

## 2020-03-21 NOTE — Patient Instructions (Signed)
Medication Instructions:  Your physician recommends that you continue on your current medications as directed. Please refer to the Current Medication list given to you today.  *If you need a refill on your cardiac medications before your next appointment, please call your pharmacy*   Lab Work: NONE TODAY If you have labs (blood work) drawn today and your tests are completely normal, you will receive your results only by: Marland Kitchen MyChart Message (if you have MyChart) OR . A paper copy in the mail If you have any lab test that is abnormal or we need to change your treatment, we will call you to review the results.   Testing/Procedures: REPEAT CHEST XRAY IN 1 MONTH (04/21/20) AT [place]. NO APPOINTMENT NEEDED.   Follow-Up: At [facility], you and your health needs are our priority.  As part of our continuing mission to provide you with exceptional heart care, we have created designated Provider Care Teams.  These Care Teams include your primary Cardiologist (physician) and Advanced Practice Providers (APPs -  Physician Assistants and Nurse Practitioners) who all work together to provide you with the care you need, when you need it.  We recommend signing up for the patient portal called "MyChart".  Sign up information is provided on this After Visit Summary.  MyChart is used to connect with patients for Virtual Visits (Telemedicine).  Patients are able to view lab/test results, encounter notes, upcoming appointments, etc.  Non-urgent messages can be sent to your provider as well.   To learn more about what you can do with MyChart, go to [email or web address].    Your next appointment:   3 month(s)  The format for your next appointment:   In Person  Provider:   Thompson Grayer, MD   Other Instructions NONE      Thank you for choosing [facility] !

## 2020-04-02 ENCOUNTER — Ambulatory Visit (INDEPENDENT_AMBULATORY_CARE_PROVIDER_SITE_OTHER): Payer: PPO | Admitting: Emergency Medicine

## 2020-04-02 ENCOUNTER — Other Ambulatory Visit: Payer: Self-pay

## 2020-04-02 DIAGNOSIS — R001 Bradycardia, unspecified: Secondary | ICD-10-CM | POA: Diagnosis not present

## 2020-04-02 LAB — CUP PACEART INCLINIC DEVICE CHECK
Battery Remaining Longevity: 111 mo
Battery Voltage: 3.11 V
Brady Statistic RA Percent Paced: 43 %
Brady Statistic RV Percent Paced: 0.34 %
Date Time Interrogation Session: 20210817085216
Implantable Lead Implant Date: 20210803
Implantable Lead Implant Date: 20210803
Implantable Lead Location: 753859
Implantable Lead Location: 753860
Implantable Pulse Generator Implant Date: 20210803
Lead Channel Impedance Value: 512.5 Ohm
Lead Channel Impedance Value: 550 Ohm
Lead Channel Pacing Threshold Amplitude: 0.5 V
Lead Channel Pacing Threshold Amplitude: 0.75 V
Lead Channel Pacing Threshold Pulse Width: 0.5 ms
Lead Channel Pacing Threshold Pulse Width: 0.5 ms
Lead Channel Sensing Intrinsic Amplitude: 12 mV
Lead Channel Sensing Intrinsic Amplitude: 5 mV
Lead Channel Setting Pacing Amplitude: 3.5 V
Lead Channel Setting Pacing Amplitude: 3.5 V
Lead Channel Setting Pacing Pulse Width: 0.5 ms
Lead Channel Setting Sensing Sensitivity: 2 mV
Pulse Gen Model: 2272
Pulse Gen Serial Number: 3851870

## 2020-04-02 NOTE — Progress Notes (Signed)
Wound check appointment. Steri-strips removed. Wound without redness or edema. Incision edges approximated, wound well healed. Normal device function. Thresholds, sensing, and impedances consistent with implant measurements. Device programmed at 3.5V/auto capture programmed on for extra safety margin until 3 month visit. Histogram distribution appropriate for patient and level of activity. No mode switches or high ventricular rates noted. Patient educated about wound care, arm mobility, lifting restrictions. ROV with Dr Rayann Heman 07/03/20. Enrolled in remote monitoring and next transmission 06/19/20.

## 2020-05-06 ENCOUNTER — Other Ambulatory Visit: Payer: Self-pay

## 2020-05-06 ENCOUNTER — Ambulatory Visit (HOSPITAL_COMMUNITY)
Admission: RE | Admit: 2020-05-06 | Discharge: 2020-05-06 | Disposition: A | Discharge status: Home / Self Care | Payer: PPO | Source: Ambulatory Visit | Attending: Student | Admitting: Student

## 2020-05-06 DIAGNOSIS — R9389 Abnormal findings on diagnostic imaging of other specified body structures: Secondary | ICD-10-CM | POA: Diagnosis not present

## 2020-05-06 DIAGNOSIS — J449 Chronic obstructive pulmonary disease, unspecified: Secondary | ICD-10-CM | POA: Diagnosis not present

## 2020-05-06 DIAGNOSIS — R911 Solitary pulmonary nodule: Secondary | ICD-10-CM | POA: Diagnosis not present

## 2020-05-24 DIAGNOSIS — I495 Sick sinus syndrome: Secondary | ICD-10-CM | POA: Diagnosis not present

## 2020-05-24 DIAGNOSIS — I10 Essential (primary) hypertension: Secondary | ICD-10-CM | POA: Diagnosis not present

## 2020-05-24 DIAGNOSIS — E782 Mixed hyperlipidemia: Secondary | ICD-10-CM | POA: Diagnosis not present

## 2020-05-24 DIAGNOSIS — E1140 Type 2 diabetes mellitus with diabetic neuropathy, unspecified: Secondary | ICD-10-CM | POA: Diagnosis not present

## 2020-05-24 DIAGNOSIS — E041 Nontoxic single thyroid nodule: Secondary | ICD-10-CM | POA: Diagnosis not present

## 2020-05-24 DIAGNOSIS — M25561 Pain in right knee: Secondary | ICD-10-CM | POA: Diagnosis not present

## 2020-06-19 ENCOUNTER — Ambulatory Visit (INDEPENDENT_AMBULATORY_CARE_PROVIDER_SITE_OTHER): Payer: PPO

## 2020-06-19 DIAGNOSIS — I495 Sick sinus syndrome: Secondary | ICD-10-CM

## 2020-06-20 LAB — CUP PACEART REMOTE DEVICE CHECK
Battery Remaining Longevity: 86 mo
Battery Remaining Percentage: 95.5 %
Battery Voltage: 3.02 V
Brady Statistic AP VP Percent: 2.5 %
Brady Statistic AP VS Percent: 32 %
Brady Statistic AS VP Percent: 1 %
Brady Statistic AS VS Percent: 66 %
Brady Statistic RA Percent Paced: 33 %
Brady Statistic RV Percent Paced: 2.5 %
Date Time Interrogation Session: 20211103182624
Implantable Lead Implant Date: 20210803
Implantable Lead Implant Date: 20210803
Implantable Lead Location: 753859
Implantable Lead Location: 753860
Implantable Pulse Generator Implant Date: 20210803
Lead Channel Impedance Value: 450 Ohm
Lead Channel Impedance Value: 530 Ohm
Lead Channel Pacing Threshold Amplitude: 0.5 V
Lead Channel Pacing Threshold Amplitude: 0.75 V
Lead Channel Pacing Threshold Pulse Width: 0.5 ms
Lead Channel Pacing Threshold Pulse Width: 0.5 ms
Lead Channel Sensing Intrinsic Amplitude: 12 mV
Lead Channel Sensing Intrinsic Amplitude: 5 mV
Lead Channel Setting Pacing Amplitude: 3.5 V
Lead Channel Setting Pacing Amplitude: 3.5 V
Lead Channel Setting Pacing Pulse Width: 0.5 ms
Lead Channel Setting Sensing Sensitivity: 2 mV
Pulse Gen Model: 2272
Pulse Gen Serial Number: 3851870

## 2020-06-21 NOTE — Progress Notes (Signed)
Remote pacemaker transmission.   

## 2020-06-26 DIAGNOSIS — E782 Mixed hyperlipidemia: Secondary | ICD-10-CM | POA: Diagnosis not present

## 2020-06-26 DIAGNOSIS — E041 Nontoxic single thyroid nodule: Secondary | ICD-10-CM | POA: Diagnosis not present

## 2020-06-26 DIAGNOSIS — I10 Essential (primary) hypertension: Secondary | ICD-10-CM | POA: Diagnosis not present

## 2020-06-26 DIAGNOSIS — E1140 Type 2 diabetes mellitus with diabetic neuropathy, unspecified: Secondary | ICD-10-CM | POA: Diagnosis not present

## 2020-06-26 DIAGNOSIS — M25561 Pain in right knee: Secondary | ICD-10-CM | POA: Diagnosis not present

## 2020-06-26 DIAGNOSIS — I495 Sick sinus syndrome: Secondary | ICD-10-CM | POA: Diagnosis not present

## 2020-07-01 DIAGNOSIS — H40003 Preglaucoma, unspecified, bilateral: Secondary | ICD-10-CM | POA: Diagnosis not present

## 2020-07-03 ENCOUNTER — Other Ambulatory Visit: Payer: Self-pay

## 2020-07-03 ENCOUNTER — Ambulatory Visit: Payer: PPO | Admitting: Internal Medicine

## 2020-07-03 ENCOUNTER — Encounter: Payer: Self-pay | Admitting: Internal Medicine

## 2020-07-03 VITALS — BP 148/88 | HR 78 | Ht 75.0 in | Wt 214.8 lb

## 2020-07-03 DIAGNOSIS — I495 Sick sinus syndrome: Secondary | ICD-10-CM | POA: Diagnosis not present

## 2020-07-03 DIAGNOSIS — I10 Essential (primary) hypertension: Secondary | ICD-10-CM

## 2020-07-03 DIAGNOSIS — Z950 Presence of cardiac pacemaker: Secondary | ICD-10-CM | POA: Diagnosis not present

## 2020-07-03 LAB — CUP PACEART INCLINIC DEVICE CHECK
Battery Remaining Longevity: 132 mo
Battery Voltage: 3.02 V
Brady Statistic RA Percent Paced: 32 %
Brady Statistic RV Percent Paced: 2.4 %
Date Time Interrogation Session: 20211117100000
Implantable Lead Implant Date: 20210803
Implantable Lead Implant Date: 20210803
Implantable Lead Location: 753859
Implantable Lead Location: 753860
Implantable Pulse Generator Implant Date: 20210803
Lead Channel Impedance Value: 462.5 Ohm
Lead Channel Impedance Value: 537.5 Ohm
Lead Channel Pacing Threshold Amplitude: 0.5 V
Lead Channel Pacing Threshold Amplitude: 0.75 V
Lead Channel Pacing Threshold Pulse Width: 0.5 ms
Lead Channel Pacing Threshold Pulse Width: 0.5 ms
Lead Channel Sensing Intrinsic Amplitude: 12 mV
Lead Channel Sensing Intrinsic Amplitude: 5 mV
Lead Channel Setting Pacing Amplitude: 2 V
Lead Channel Setting Pacing Amplitude: 2.5 V
Lead Channel Setting Pacing Pulse Width: 0.5 ms
Lead Channel Setting Sensing Sensitivity: 2 mV
Pulse Gen Model: 2272
Pulse Gen Serial Number: 3851870

## 2020-07-03 NOTE — Progress Notes (Signed)
    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 male who presents today for routine electrophysiology followup.  Since his pacemaker implant, the patient reports doing very well.  His presyncopal/ flushing spells have resolved.  Today, he denies symptoms of palpitations, chest pain, shortness of breath,  lower extremity edema, dizziness, presyncope, or syncope.  The patient is otherwise without complaint today.   Past Medical History:  Diagnosis Date  . Hyperlipidemia   . Hypertension   . Sick sinus syndrome [facility])    a. s/p St. Jude PPM placement in 03/2020   Past Surgical History:  Procedure Laterality Date  . COLONOSCOPY N/A 09/16/2015   Procedure: COLONOSCOPY;  Surgeon: Danie Binder, MD;  [facility];  Service: Endoscopy;  Laterality: N/A;  1230pm  . COLONOSCOPY N/A 02/10/2019   Procedure: COLONOSCOPY;  Surgeon: Danie Binder, MD;  [facility];  Service: Endoscopy;  Laterality: N/A;  2:00  . KNEE ARTHROSCOPY WITH MEDIAL MENISECTOMY Right 03/11/2016   Procedure: KNEE ARTHROSCOPY WITH MEDIAL MENISECTOMY;  Surgeon: Carole Civil, MD;  Location: [facility] OR[facility];  Service: Orthopedics;  Laterality: Right;  . None to date     As of 08/29/15  . PACEMAKER IMPLANT N/A 03/19/2020   Procedure: PACEMAKER IMPLANT;  Surgeon: Thompson Grayer, MD;  Location: [place] CV LAB;  Service: Cardiovascular;  Laterality: N/A;  . POLYPECTOMY  02/10/2019   Procedure: POLYPECTOMY;  Surgeon: Danie Binder, MD;  [facility];  Service: Endoscopy;;  colon    ROS- all systems are reviewed and negative except as per HPI above  Current Outpatient Medications  Medication Sig Dispense Refill  . aspirin 81 MG tablet Take 81 mg by mouth daily.    . cetirizine (ZYRTEC) 10 MG tablet Take 10 mg by mouth daily.     Marland Kitchen lovastatin (MEVACOR) 10 MG tablet Take 10 mg by mouth at bedtime.   0  . Multiple Vitamin  (MULTIVITAMIN) capsule Take 1 capsule by mouth daily.    Marland Kitchen olmesartan (BENICAR) 20 MG tablet Take 20 mg by mouth daily.     No current facility-administered medications for this visit.    Physical Exam: Vitals:   07/03/20 0858  BP: (!) 148/88  Pulse: 78  SpO2: 96%  Weight: 214 lb 12.8 oz (97.4 kg)  Height: 6\' 3"  (1.905 m)    GEN- The patient is well appearing, alert and oriented x 3 today.   Head- normocephalic, atraumatic Eyes-  Sclera clear, conjunctiva pink Ears- hearing intact Oropharynx- clear Lungs- Clear to ausculation bilaterally, normal work of breathing Chest- pacemaker pocket is well healed Heart- Regular rate and rhythm, no murmurs, rubs or gallops, PMI not laterally displaced GI- soft, NT, ND, + BS Extremities- no clubbing, cyanosis, or edema  Pacemaker interrogation- reviewed in detail today,  See PACEART report  ekg tracing ordered today is personally reviewed and shows sinus  Assessment and Plan:  1. Symptomatic sinus bradycardia  Normal pacemaker function Presyncope is resolved See Pace Art report No changes today he is not device dependant today  2. HTN Stable No change required today   Risks, benefits and potential toxicities for medications prescribed and/or refilled reviewed with patient today.   Return to see me in a year  Thompson Grayer MD, [facility] 07/03/2020 9:14 AM

## 2020-07-03 NOTE — Patient Instructions (Signed)
Medication Instructions:  Your physician recommends that you continue on your current medications as directed. Please refer to the Current Medication list given to you today.  *If you need a refill on your cardiac medications before your next appointment, please call your pharmacy*  Lab Work: None ordered.  If you have labs (blood work) drawn today and your tests are completely normal, you will receive your results only by: Marland Kitchen MyChart Message (if you have MyChart) OR . A paper copy in the mail If you have any lab test that is abnormal or we need to change your treatment, we will call you to review the results.  Testing/Procedures: None ordered.  Follow-Up: At [facility], you and your health needs are our priority.  As part of our continuing mission to provide you with exceptional heart care, we have created designated Provider Care Teams.  These Care Teams include your primary Cardiologist (physician) and Advanced Practice Providers (APPs -  Physician Assistants and Nurse Practitioners) who all work together to provide you with the care you need, when you need it.  We recommend signing up for the patient portal called "MyChart".  Sign up information is provided on this After Visit Summary.  MyChart is used to connect with patients for Virtual Visits (Telemedicine).  Patients are able to view lab/test results, encounter notes, upcoming appointments, etc.  Non-urgent messages can be sent to your provider as well.   To learn more about what you can do with MyChart, go to [email or web address].    Your next appointment:   Your physician wants you to follow-up in: 1 year with Dr. Rayann Heman. You will receive a reminder letter in the mail two months in advance. If you don't receive a letter, please call our office to schedule the follow-up appointment.  Remote monitoring is used to monitor your Pacemaker from home. This monitoring reduces the number of office visits required to check your device  to one time per year. It allows [place] to keep an eye on the functioning of your device to ensure it is working properly. You are scheduled for a device check from home on 09-18-20. You may send your transmission at any time that day. If you have a wireless device, the transmission will be sent automatically. After your physician reviews your transmission, you will receive a postcard with your next transmission date.  Other Instructions:

## 2020-07-04 NOTE — Addendum Note (Signed)
Addended by: Rose Phi on: 07/04/2020 08:37 AM   Modules accepted: Orders

## 2020-08-07 DIAGNOSIS — E782 Mixed hyperlipidemia: Secondary | ICD-10-CM | POA: Diagnosis not present

## 2020-08-07 DIAGNOSIS — E1140 Type 2 diabetes mellitus with diabetic neuropathy, unspecified: Secondary | ICD-10-CM | POA: Diagnosis not present

## 2020-08-07 DIAGNOSIS — I10 Essential (primary) hypertension: Secondary | ICD-10-CM | POA: Diagnosis not present

## 2020-08-07 DIAGNOSIS — E119 Type 2 diabetes mellitus without complications: Secondary | ICD-10-CM | POA: Diagnosis not present

## 2020-08-07 DIAGNOSIS — Z712 Person consulting for explanation of examination or test findings: Secondary | ICD-10-CM | POA: Diagnosis not present

## 2020-08-07 DIAGNOSIS — E0840 Diabetes mellitus due to underlying condition with diabetic neuropathy, unspecified: Secondary | ICD-10-CM | POA: Diagnosis not present

## 2020-08-07 DIAGNOSIS — L501 Idiopathic urticaria: Secondary | ICD-10-CM | POA: Diagnosis not present

## 2020-08-07 DIAGNOSIS — R945 Abnormal results of liver function studies: Secondary | ICD-10-CM | POA: Diagnosis not present

## 2020-08-07 DIAGNOSIS — E041 Nontoxic single thyroid nodule: Secondary | ICD-10-CM | POA: Diagnosis not present

## 2020-08-07 DIAGNOSIS — E875 Hyperkalemia: Secondary | ICD-10-CM | POA: Diagnosis not present

## 2020-08-07 DIAGNOSIS — R7301 Impaired fasting glucose: Secondary | ICD-10-CM | POA: Diagnosis not present

## 2020-08-13 DIAGNOSIS — E1140 Type 2 diabetes mellitus with diabetic neuropathy, unspecified: Secondary | ICD-10-CM | POA: Diagnosis not present

## 2020-08-13 DIAGNOSIS — M19049 Primary osteoarthritis, unspecified hand: Secondary | ICD-10-CM | POA: Diagnosis not present

## 2020-08-13 DIAGNOSIS — I495 Sick sinus syndrome: Secondary | ICD-10-CM | POA: Diagnosis not present

## 2020-08-13 DIAGNOSIS — E041 Nontoxic single thyroid nodule: Secondary | ICD-10-CM | POA: Diagnosis not present

## 2020-08-13 DIAGNOSIS — E782 Mixed hyperlipidemia: Secondary | ICD-10-CM | POA: Diagnosis not present

## 2020-08-13 DIAGNOSIS — I10 Essential (primary) hypertension: Secondary | ICD-10-CM | POA: Diagnosis not present

## 2020-08-13 DIAGNOSIS — M25561 Pain in right knee: Secondary | ICD-10-CM | POA: Diagnosis not present

## 2020-08-16 DIAGNOSIS — E1140 Type 2 diabetes mellitus with diabetic neuropathy, unspecified: Secondary | ICD-10-CM | POA: Diagnosis not present

## 2020-08-16 DIAGNOSIS — I495 Sick sinus syndrome: Secondary | ICD-10-CM | POA: Diagnosis not present

## 2020-08-16 DIAGNOSIS — E041 Nontoxic single thyroid nodule: Secondary | ICD-10-CM | POA: Diagnosis not present

## 2020-08-16 DIAGNOSIS — M25561 Pain in right knee: Secondary | ICD-10-CM | POA: Diagnosis not present

## 2020-08-16 DIAGNOSIS — E782 Mixed hyperlipidemia: Secondary | ICD-10-CM | POA: Diagnosis not present

## 2020-08-16 DIAGNOSIS — I10 Essential (primary) hypertension: Secondary | ICD-10-CM | POA: Diagnosis not present

## 2020-09-14 DIAGNOSIS — I10 Essential (primary) hypertension: Secondary | ICD-10-CM | POA: Diagnosis not present

## 2020-09-14 DIAGNOSIS — E782 Mixed hyperlipidemia: Secondary | ICD-10-CM | POA: Diagnosis not present

## 2020-09-14 DIAGNOSIS — M25561 Pain in right knee: Secondary | ICD-10-CM | POA: Diagnosis not present

## 2020-09-14 DIAGNOSIS — E041 Nontoxic single thyroid nodule: Secondary | ICD-10-CM | POA: Diagnosis not present

## 2020-09-14 DIAGNOSIS — M19049 Primary osteoarthritis, unspecified hand: Secondary | ICD-10-CM | POA: Diagnosis not present

## 2020-09-14 DIAGNOSIS — E1140 Type 2 diabetes mellitus with diabetic neuropathy, unspecified: Secondary | ICD-10-CM | POA: Diagnosis not present

## 2020-09-14 DIAGNOSIS — I495 Sick sinus syndrome: Secondary | ICD-10-CM | POA: Diagnosis not present

## 2020-09-18 ENCOUNTER — Ambulatory Visit (INDEPENDENT_AMBULATORY_CARE_PROVIDER_SITE_OTHER): Payer: PPO

## 2020-09-18 DIAGNOSIS — I495 Sick sinus syndrome: Secondary | ICD-10-CM | POA: Diagnosis not present

## 2020-09-19 LAB — CUP PACEART REMOTE DEVICE CHECK
Battery Remaining Longevity: 116 mo
Battery Remaining Percentage: 95.5 %
Battery Voltage: 3.02 V
Brady Statistic AP VP Percent: 2.4 %
Brady Statistic AP VS Percent: 30 %
Brady Statistic AS VP Percent: 1 %
Brady Statistic AS VS Percent: 67 %
Brady Statistic RA Percent Paced: 32 %
Brady Statistic RV Percent Paced: 2.5 %
Date Time Interrogation Session: 20220202220935
Implantable Lead Implant Date: 20210803
Implantable Lead Implant Date: 20210803
Implantable Lead Location: 753859
Implantable Lead Location: 753860
Implantable Pulse Generator Implant Date: 20210803
Lead Channel Impedance Value: 450 Ohm
Lead Channel Impedance Value: 510 Ohm
Lead Channel Pacing Threshold Amplitude: 0.375 V
Lead Channel Pacing Threshold Amplitude: 0.75 V
Lead Channel Pacing Threshold Pulse Width: 0.5 ms
Lead Channel Pacing Threshold Pulse Width: 0.5 ms
Lead Channel Sensing Intrinsic Amplitude: 12 mV
Lead Channel Sensing Intrinsic Amplitude: 5 mV
Lead Channel Setting Pacing Amplitude: 2 V
Lead Channel Setting Pacing Amplitude: 2.5 V
Lead Channel Setting Pacing Pulse Width: 0.5 ms
Lead Channel Setting Sensing Sensitivity: 2 mV
Pulse Gen Model: 2272
Pulse Gen Serial Number: 3851870

## 2020-09-23 NOTE — Progress Notes (Signed)
Remote pacemaker transmission.   

## 2020-10-14 DIAGNOSIS — E041 Nontoxic single thyroid nodule: Secondary | ICD-10-CM | POA: Diagnosis not present

## 2020-10-14 DIAGNOSIS — M19049 Primary osteoarthritis, unspecified hand: Secondary | ICD-10-CM | POA: Diagnosis not present

## 2020-10-14 DIAGNOSIS — I10 Essential (primary) hypertension: Secondary | ICD-10-CM | POA: Diagnosis not present

## 2020-10-14 DIAGNOSIS — M25561 Pain in right knee: Secondary | ICD-10-CM | POA: Diagnosis not present

## 2020-10-14 DIAGNOSIS — I495 Sick sinus syndrome: Secondary | ICD-10-CM | POA: Diagnosis not present

## 2020-10-14 DIAGNOSIS — E782 Mixed hyperlipidemia: Secondary | ICD-10-CM | POA: Diagnosis not present

## 2020-10-14 DIAGNOSIS — E1140 Type 2 diabetes mellitus with diabetic neuropathy, unspecified: Secondary | ICD-10-CM | POA: Diagnosis not present

## 2020-11-13 DIAGNOSIS — I10 Essential (primary) hypertension: Secondary | ICD-10-CM | POA: Diagnosis not present

## 2020-11-13 DIAGNOSIS — E782 Mixed hyperlipidemia: Secondary | ICD-10-CM | POA: Diagnosis not present

## 2020-11-13 DIAGNOSIS — E1140 Type 2 diabetes mellitus with diabetic neuropathy, unspecified: Secondary | ICD-10-CM | POA: Diagnosis not present

## 2020-11-13 DIAGNOSIS — E041 Nontoxic single thyroid nodule: Secondary | ICD-10-CM | POA: Diagnosis not present

## 2020-11-18 DIAGNOSIS — M542 Cervicalgia: Secondary | ICD-10-CM | POA: Diagnosis not present

## 2020-11-28 ENCOUNTER — Other Ambulatory Visit (HOSPITAL_COMMUNITY): Payer: Self-pay | Admitting: Internal Medicine

## 2020-11-28 ENCOUNTER — Other Ambulatory Visit: Payer: Self-pay | Admitting: Internal Medicine

## 2020-11-28 DIAGNOSIS — M542 Cervicalgia: Secondary | ICD-10-CM

## 2020-12-10 DIAGNOSIS — M542 Cervicalgia: Secondary | ICD-10-CM | POA: Diagnosis not present

## 2020-12-18 ENCOUNTER — Ambulatory Visit (INDEPENDENT_AMBULATORY_CARE_PROVIDER_SITE_OTHER): Payer: PPO

## 2020-12-18 DIAGNOSIS — I495 Sick sinus syndrome: Secondary | ICD-10-CM | POA: Diagnosis not present

## 2020-12-19 LAB — CUP PACEART REMOTE DEVICE CHECK
Battery Remaining Longevity: 117 mo
Battery Remaining Percentage: 95.5 %
Battery Voltage: 3.02 V
Brady Statistic AP VP Percent: 2.7 %
Brady Statistic AP VS Percent: 32 %
Brady Statistic AS VP Percent: 1 %
Brady Statistic AS VS Percent: 65 %
Brady Statistic RA Percent Paced: 33 %
Brady Statistic RV Percent Paced: 2.8 %
Date Time Interrogation Session: 20220504175140
Implantable Lead Implant Date: 20210803
Implantable Lead Implant Date: 20210803
Implantable Lead Location: 753859
Implantable Lead Location: 753860
Implantable Pulse Generator Implant Date: 20210803
Lead Channel Impedance Value: 460 Ohm
Lead Channel Impedance Value: 590 Ohm
Lead Channel Pacing Threshold Amplitude: 0.5 V
Lead Channel Pacing Threshold Amplitude: 0.75 V
Lead Channel Pacing Threshold Pulse Width: 0.5 ms
Lead Channel Pacing Threshold Pulse Width: 0.5 ms
Lead Channel Sensing Intrinsic Amplitude: 12 mV
Lead Channel Sensing Intrinsic Amplitude: 5 mV
Lead Channel Setting Pacing Amplitude: 2 V
Lead Channel Setting Pacing Amplitude: 2.5 V
Lead Channel Setting Pacing Pulse Width: 0.5 ms
Lead Channel Setting Sensing Sensitivity: 2 mV
Pulse Gen Model: 2272
Pulse Gen Serial Number: 3851870

## 2020-12-24 ENCOUNTER — Other Ambulatory Visit: Payer: Self-pay

## 2020-12-24 ENCOUNTER — Ambulatory Visit (HOSPITAL_COMMUNITY)
Admission: RE | Admit: 2020-12-24 | Discharge: 2020-12-24 | Disposition: A | Discharge status: Home / Self Care | Payer: PPO | Source: Ambulatory Visit | Attending: Internal Medicine | Admitting: Internal Medicine

## 2020-12-24 DIAGNOSIS — M50222 Other cervical disc displacement at C5-C6 level: Secondary | ICD-10-CM | POA: Diagnosis not present

## 2020-12-24 DIAGNOSIS — M5021 Other cervical disc displacement,  high cervical region: Secondary | ICD-10-CM | POA: Diagnosis not present

## 2020-12-24 DIAGNOSIS — M50221 Other cervical disc displacement at C4-C5 level: Secondary | ICD-10-CM | POA: Diagnosis not present

## 2020-12-24 DIAGNOSIS — M542 Cervicalgia: Secondary | ICD-10-CM | POA: Diagnosis not present

## 2020-12-24 DIAGNOSIS — M4802 Spinal stenosis, cervical region: Secondary | ICD-10-CM | POA: Diagnosis not present

## 2020-12-24 NOTE — Progress Notes (Signed)
Per order, Changed device settings for MRI to  DOO at 100 bpm  Will program device back to pre-MRI settings after completion of exam, and send transmission

## 2021-01-03 DIAGNOSIS — E119 Type 2 diabetes mellitus without complications: Secondary | ICD-10-CM | POA: Diagnosis not present

## 2021-01-03 DIAGNOSIS — E782 Mixed hyperlipidemia: Secondary | ICD-10-CM | POA: Diagnosis not present

## 2021-01-07 NOTE — Progress Notes (Signed)
Remote pacemaker transmission.   

## 2021-01-08 DIAGNOSIS — M179 Osteoarthritis of knee, unspecified: Secondary | ICD-10-CM | POA: Diagnosis not present

## 2021-01-08 DIAGNOSIS — M4802 Spinal stenosis, cervical region: Secondary | ICD-10-CM | POA: Diagnosis not present

## 2021-01-08 DIAGNOSIS — R945 Abnormal results of liver function studies: Secondary | ICD-10-CM | POA: Diagnosis not present

## 2021-01-08 DIAGNOSIS — I495 Sick sinus syndrome: Secondary | ICD-10-CM | POA: Diagnosis not present

## 2021-01-08 DIAGNOSIS — Z87448 Personal history of other diseases of urinary system: Secondary | ICD-10-CM | POA: Diagnosis not present

## 2021-01-08 DIAGNOSIS — E782 Mixed hyperlipidemia: Secondary | ICD-10-CM | POA: Diagnosis not present

## 2021-01-08 DIAGNOSIS — E1140 Type 2 diabetes mellitus with diabetic neuropathy, unspecified: Secondary | ICD-10-CM | POA: Diagnosis not present

## 2021-01-08 DIAGNOSIS — E875 Hyperkalemia: Secondary | ICD-10-CM | POA: Diagnosis not present

## 2021-01-08 DIAGNOSIS — E041 Nontoxic single thyroid nodule: Secondary | ICD-10-CM | POA: Diagnosis not present

## 2021-01-08 DIAGNOSIS — I10 Essential (primary) hypertension: Secondary | ICD-10-CM | POA: Diagnosis not present

## 2021-02-13 DIAGNOSIS — I10 Essential (primary) hypertension: Secondary | ICD-10-CM | POA: Diagnosis not present

## 2021-02-13 DIAGNOSIS — K5900 Constipation, unspecified: Secondary | ICD-10-CM | POA: Diagnosis not present

## 2021-02-13 DIAGNOSIS — E1165 Type 2 diabetes mellitus with hyperglycemia: Secondary | ICD-10-CM | POA: Diagnosis not present

## 2021-03-05 DIAGNOSIS — I10 Essential (primary) hypertension: Secondary | ICD-10-CM | POA: Diagnosis not present

## 2021-03-05 DIAGNOSIS — M5412 Radiculopathy, cervical region: Secondary | ICD-10-CM | POA: Diagnosis not present

## 2021-03-05 DIAGNOSIS — Z6826 Body mass index (BMI) 26.0-26.9, adult: Secondary | ICD-10-CM | POA: Diagnosis not present

## 2021-03-16 DIAGNOSIS — I10 Essential (primary) hypertension: Secondary | ICD-10-CM | POA: Diagnosis not present

## 2021-03-16 DIAGNOSIS — E1165 Type 2 diabetes mellitus with hyperglycemia: Secondary | ICD-10-CM | POA: Diagnosis not present

## 2021-03-19 ENCOUNTER — Ambulatory Visit (INDEPENDENT_AMBULATORY_CARE_PROVIDER_SITE_OTHER): Payer: PPO

## 2021-03-19 DIAGNOSIS — I495 Sick sinus syndrome: Secondary | ICD-10-CM | POA: Diagnosis not present

## 2021-03-19 LAB — CUP PACEART REMOTE DEVICE CHECK
Battery Remaining Longevity: 110 mo
Battery Remaining Percentage: 95 %
Battery Voltage: 3.01 V
Brady Statistic AP VP Percent: 1 %
Brady Statistic AP VS Percent: 33 %
Brady Statistic AS VP Percent: 1 %
Brady Statistic AS VS Percent: 66 %
Brady Statistic RA Percent Paced: 33 %
Brady Statistic RV Percent Paced: 1 %
Date Time Interrogation Session: 20220803182910
Implantable Lead Implant Date: 20210803
Implantable Lead Implant Date: 20210803
Implantable Lead Location: 753859
Implantable Lead Location: 753860
Implantable Pulse Generator Implant Date: 20210803
Lead Channel Impedance Value: 440 Ohm
Lead Channel Impedance Value: 450 Ohm
Lead Channel Pacing Threshold Amplitude: 0.5 V
Lead Channel Pacing Threshold Amplitude: 1 V
Lead Channel Pacing Threshold Pulse Width: 0.5 ms
Lead Channel Pacing Threshold Pulse Width: 0.5 ms
Lead Channel Sensing Intrinsic Amplitude: 12 mV
Lead Channel Sensing Intrinsic Amplitude: 5 mV
Lead Channel Setting Pacing Amplitude: 2 V
Lead Channel Setting Pacing Amplitude: 2.5 V
Lead Channel Setting Pacing Pulse Width: 0.5 ms
Lead Channel Setting Sensing Sensitivity: 2 mV
Pulse Gen Model: 2272
Pulse Gen Serial Number: 3851870

## 2021-04-15 NOTE — Progress Notes (Signed)
Remote pacemaker transmission.   

## 2021-04-16 DIAGNOSIS — I10 Essential (primary) hypertension: Secondary | ICD-10-CM | POA: Diagnosis not present

## 2021-04-16 DIAGNOSIS — E1165 Type 2 diabetes mellitus with hyperglycemia: Secondary | ICD-10-CM | POA: Diagnosis not present

## 2021-05-13 DIAGNOSIS — E782 Mixed hyperlipidemia: Secondary | ICD-10-CM | POA: Diagnosis not present

## 2021-05-15 DIAGNOSIS — I495 Sick sinus syndrome: Secondary | ICD-10-CM | POA: Diagnosis not present

## 2021-05-15 DIAGNOSIS — M5030 Other cervical disc degeneration, unspecified cervical region: Secondary | ICD-10-CM | POA: Diagnosis not present

## 2021-05-15 DIAGNOSIS — R945 Abnormal results of liver function studies: Secondary | ICD-10-CM | POA: Diagnosis not present

## 2021-05-15 DIAGNOSIS — E782 Mixed hyperlipidemia: Secondary | ICD-10-CM | POA: Diagnosis not present

## 2021-05-15 DIAGNOSIS — E1140 Type 2 diabetes mellitus with diabetic neuropathy, unspecified: Secondary | ICD-10-CM | POA: Diagnosis not present

## 2021-05-15 DIAGNOSIS — I10 Essential (primary) hypertension: Secondary | ICD-10-CM | POA: Diagnosis not present

## 2021-05-15 DIAGNOSIS — M179 Osteoarthritis of knee, unspecified: Secondary | ICD-10-CM | POA: Diagnosis not present

## 2021-05-15 DIAGNOSIS — Z87448 Personal history of other diseases of urinary system: Secondary | ICD-10-CM | POA: Diagnosis not present

## 2021-05-15 DIAGNOSIS — Z0001 Encounter for general adult medical examination with abnormal findings: Secondary | ICD-10-CM | POA: Diagnosis not present

## 2021-05-15 DIAGNOSIS — E041 Nontoxic single thyroid nodule: Secondary | ICD-10-CM | POA: Diagnosis not present

## 2021-05-15 DIAGNOSIS — E875 Hyperkalemia: Secondary | ICD-10-CM | POA: Diagnosis not present

## 2021-05-16 DIAGNOSIS — I10 Essential (primary) hypertension: Secondary | ICD-10-CM | POA: Diagnosis not present

## 2021-05-16 DIAGNOSIS — E1165 Type 2 diabetes mellitus with hyperglycemia: Secondary | ICD-10-CM | POA: Diagnosis not present

## 2021-06-16 DIAGNOSIS — I10 Essential (primary) hypertension: Secondary | ICD-10-CM | POA: Diagnosis not present

## 2021-06-16 DIAGNOSIS — E1165 Type 2 diabetes mellitus with hyperglycemia: Secondary | ICD-10-CM | POA: Diagnosis not present

## 2021-06-18 ENCOUNTER — Ambulatory Visit (INDEPENDENT_AMBULATORY_CARE_PROVIDER_SITE_OTHER): Payer: PPO

## 2021-06-18 DIAGNOSIS — I495 Sick sinus syndrome: Secondary | ICD-10-CM

## 2021-06-18 LAB — CUP PACEART REMOTE DEVICE CHECK
Battery Remaining Longevity: 109 mo
Battery Remaining Percentage: 92 %
Battery Voltage: 3.01 V
Brady Statistic AP VP Percent: 1 %
Brady Statistic AP VS Percent: 35 %
Brady Statistic AS VP Percent: 1 %
Brady Statistic AS VS Percent: 64 %
Brady Statistic RA Percent Paced: 35 %
Brady Statistic RV Percent Paced: 1 %
Date Time Interrogation Session: 20221102020012
Implantable Lead Implant Date: 20210803
Implantable Lead Implant Date: 20210803
Implantable Lead Location: 753859
Implantable Lead Location: 753860
Implantable Pulse Generator Implant Date: 20210803
Lead Channel Impedance Value: 460 Ohm
Lead Channel Impedance Value: 540 Ohm
Lead Channel Pacing Threshold Amplitude: 0.5 V
Lead Channel Pacing Threshold Amplitude: 1 V
Lead Channel Pacing Threshold Pulse Width: 0.5 ms
Lead Channel Pacing Threshold Pulse Width: 0.5 ms
Lead Channel Sensing Intrinsic Amplitude: 12 mV
Lead Channel Sensing Intrinsic Amplitude: 5 mV
Lead Channel Setting Pacing Amplitude: 2 V
Lead Channel Setting Pacing Amplitude: 2.5 V
Lead Channel Setting Pacing Pulse Width: 0.5 ms
Lead Channel Setting Sensing Sensitivity: 2 mV
Pulse Gen Model: 2272
Pulse Gen Serial Number: 3851870

## 2021-06-24 NOTE — Progress Notes (Signed)
Remote pacemaker transmission.   

## 2021-06-30 ENCOUNTER — Other Ambulatory Visit: Payer: Self-pay

## 2021-06-30 ENCOUNTER — Encounter: Payer: Self-pay | Admitting: Internal Medicine

## 2021-06-30 ENCOUNTER — Ambulatory Visit: Payer: PPO | Admitting: Internal Medicine

## 2021-06-30 VITALS — BP 140/80 | HR 75 | Ht 75.0 in | Wt 216.9 lb

## 2021-06-30 DIAGNOSIS — I10 Essential (primary) hypertension: Secondary | ICD-10-CM

## 2021-06-30 DIAGNOSIS — R001 Bradycardia, unspecified: Secondary | ICD-10-CM | POA: Diagnosis not present

## 2021-06-30 LAB — CUP PACEART INCLINIC DEVICE CHECK
Battery Remaining Longevity: 121 mo
Battery Voltage: 3.01 V
Brady Statistic RA Percent Paced: 35 %
Brady Statistic RV Percent Paced: 0.96 %
Date Time Interrogation Session: 20221114143015
Implantable Lead Implant Date: 20210803
Implantable Lead Implant Date: 20210803
Implantable Lead Location: 753859
Implantable Lead Location: 753860
Implantable Pulse Generator Implant Date: 20210803
Lead Channel Impedance Value: 462.5 Ohm
Lead Channel Impedance Value: 512.5 Ohm
Lead Channel Pacing Threshold Amplitude: 0.5 V
Lead Channel Pacing Threshold Amplitude: 0.75 V
Lead Channel Pacing Threshold Pulse Width: 0.5 ms
Lead Channel Pacing Threshold Pulse Width: 0.5 ms
Lead Channel Sensing Intrinsic Amplitude: 12 mV
Lead Channel Sensing Intrinsic Amplitude: 5 mV
Lead Channel Setting Pacing Amplitude: 2 V
Lead Channel Setting Pacing Amplitude: 2.5 V
Lead Channel Setting Pacing Pulse Width: 0.5 ms
Lead Channel Setting Sensing Sensitivity: 2 mV
Pulse Gen Model: 2272
Pulse Gen Serial Number: 3851870

## 2021-06-30 NOTE — Patient Instructions (Addendum)
Medication Instructions:  Your physician recommends that you continue on your current medications as directed. Please refer to the Current Medication list given to you today. *If you need a refill on your cardiac medications before your next appointment, please call your pharmacy*  Lab Work: None. If you have labs (blood work) drawn today and your tests are completely normal, you will receive your results only by: [place] (if you have MyChart) OR A paper copy in the mail If you have any lab test that is abnormal or we need to change your treatment, we will call you to review the results.  Testing/Procedures: None.  Follow-Up: At [facility], you and your health needs are our priority.  As part of our continuing mission to provide you with exceptional heart care, we have created designated Provider Care Teams.  These Care Teams include your primary Cardiologist (physician) and Advanced Practice Providers (APPs -  Physician Assistants and Nurse Practitioners) who all work together to provide you with the care you need, when you need it.  Your physician wants you to follow-up in: 12 months with   one of the following Advanced Practice Providers on your designated Care Team:    Tommye Standard, PA-C   You will receive a reminder letter in the mail two months in advance. If you don't receive a letter, please call our office to schedule the follow-up appointment.  Remote monitoring is used to monitor your Pacemaker from home. This monitoring reduces the number of office visits required to check your device to one time per year. It allows [place] to keep an eye on the functioning of your device to ensure it is working properly. You are scheduled for a device check from home on 09/17/21. You may send your transmission at any time that day. If you have a wireless device, the transmission will be sent automatically. After your physician reviews your transmission, you will receive a postcard with your  next transmission date.  We recommend signing up for the patient portal called "MyChart".  Sign up information is provided on this After Visit Summary.  MyChart is used to connect with patients for Virtual Visits (Telemedicine).  Patients are able to view lab/test results, encounter notes, upcoming appointments, etc.  Non-urgent messages can be sent to your provider as well.   To learn more about what you can do with MyChart, go to [email or web address].    Any Other Special Instructions Will Be Listed Below (If Applicable).

## 2021-06-30 NOTE — Progress Notes (Signed)
    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 male who presents today for routine electrophysiology followup.  Since last being seen in our clinic, the patient reports doing very well.  Today, he denies symptoms of palpitations, chest pain, shortness of breath,  lower extremity edema, dizziness, presyncope, or syncope.  The patient is otherwise without complaint today.   Past Medical History:  Diagnosis Date   Hyperlipidemia    Hypertension    Sick sinus syndrome ([place])    a. s/p St. Jude PPM placement in 03/2020   Past Surgical History:  Procedure Laterality Date   COLONOSCOPY N/A 09/16/2015   Procedure: COLONOSCOPY;  Surgeon: Danie Binder, MD;  [facility];  Service: Endoscopy;  Laterality: N/A;  1230pm   COLONOSCOPY N/A 02/10/2019   Procedure: COLONOSCOPY;  Surgeon: Danie Binder, MD;  [facility];  Service: Endoscopy;  Laterality: N/A;  2:00   KNEE ARTHROSCOPY WITH MEDIAL MENISECTOMY Right 03/11/2016   Procedure: KNEE ARTHROSCOPY WITH MEDIAL MENISECTOMY;  Surgeon: Carole Civil, MD;  Location: [facility];  Service: Orthopedics;  Laterality: Right;   None to date     As of 08/29/15   PACEMAKER IMPLANT N/A 03/19/2020   Procedure: PACEMAKER IMPLANT;  Surgeon: Thompson Grayer, MD;  Location: [place] [facility];  Service: Cardiovascular;  Laterality: N/A;   POLYPECTOMY  02/10/2019   Procedure: POLYPECTOMY;  Surgeon: Danie Binder, MD;  [facility];  Service: Endoscopy;;  colon    ROS- all systems are reviewed and negative except as per HPI above  Current Outpatient Medications  Medication Sig Dispense Refill   aspirin 81 MG tablet Take 81 mg by mouth daily.     cetirizine (ZYRTEC) 10 MG tablet Take 10 mg by mouth daily.      diclofenac (VOLTAREN) 75 MG EC tablet Take 75 mg by mouth 2 (two) times daily.     HYDROcodone-acetaminophen (NORCO) 7.5-325 MG tablet Take 1 tablet by mouth every 6 (six) hours.      lovastatin (MEVACOR) 10 MG tablet Take 10 mg by mouth at bedtime.   0   Multiple Vitamin (MULTIVITAMIN) capsule Take 1 capsule by mouth daily.     olmesartan (BENICAR) 20 MG tablet Take 20 mg by mouth daily.     No current facility-administered medications for this visit.    Physical Exam: Vitals:   06/30/21 1402  BP: 140/80  Pulse: 75  SpO2: 98%  Weight: 216 lb 14.4 oz (98.4 kg)  Height: 6\' 3"  (1.905 m)    GEN- The patient is well appearing, alert and oriented x 3 today.   Head- normocephalic, atraumatic Eyes-  Sclera clear, conjunctiva pink Ears- hearing intact Oropharynx- clear Lungs- Clear to ausculation bilaterally, normal work of breathing Chest- pacemaker pocket is well healed Heart- Regular rate and rhythm, no murmurs, rubs or gallops, PMI not laterally displaced GI- soft, NT, ND, + BS Extremities- no clubbing, cyanosis, or edema  Pacemaker interrogation- reviewed in detail today,  See PACEART report  ekg tracing ordered today is personally reviewed and shows sinus with PR 208 msec  Assessment and Plan:  1. Symptomatic sinus bradycardia  Normal pacemaker function See Pace Art report No changes today he is not device dependant today  2. HTN Stable No change required today  Return in a year to see EP APP  Thompson Grayer MD, [facility] 06/30/2021 2:13 PM

## 2021-08-15 DIAGNOSIS — E782 Mixed hyperlipidemia: Secondary | ICD-10-CM | POA: Diagnosis not present

## 2021-08-15 DIAGNOSIS — I10 Essential (primary) hypertension: Secondary | ICD-10-CM | POA: Diagnosis not present

## 2021-09-12 DIAGNOSIS — E041 Nontoxic single thyroid nodule: Secondary | ICD-10-CM | POA: Diagnosis not present

## 2021-09-12 DIAGNOSIS — E782 Mixed hyperlipidemia: Secondary | ICD-10-CM | POA: Diagnosis not present

## 2021-09-12 DIAGNOSIS — E1140 Type 2 diabetes mellitus with diabetic neuropathy, unspecified: Secondary | ICD-10-CM | POA: Diagnosis not present

## 2021-09-16 DIAGNOSIS — E1140 Type 2 diabetes mellitus with diabetic neuropathy, unspecified: Secondary | ICD-10-CM | POA: Diagnosis not present

## 2021-09-16 DIAGNOSIS — I10 Essential (primary) hypertension: Secondary | ICD-10-CM | POA: Diagnosis not present

## 2021-09-16 DIAGNOSIS — E041 Nontoxic single thyroid nodule: Secondary | ICD-10-CM | POA: Diagnosis not present

## 2021-09-16 DIAGNOSIS — E875 Hyperkalemia: Secondary | ICD-10-CM | POA: Diagnosis not present

## 2021-09-16 DIAGNOSIS — M179 Osteoarthritis of knee, unspecified: Secondary | ICD-10-CM | POA: Diagnosis not present

## 2021-09-16 DIAGNOSIS — M5030 Other cervical disc degeneration, unspecified cervical region: Secondary | ICD-10-CM | POA: Diagnosis not present

## 2021-09-16 DIAGNOSIS — I495 Sick sinus syndrome: Secondary | ICD-10-CM | POA: Diagnosis not present

## 2021-09-16 DIAGNOSIS — E782 Mixed hyperlipidemia: Secondary | ICD-10-CM | POA: Diagnosis not present

## 2021-09-16 DIAGNOSIS — Z87448 Personal history of other diseases of urinary system: Secondary | ICD-10-CM | POA: Diagnosis not present

## 2021-09-16 DIAGNOSIS — R945 Abnormal results of liver function studies: Secondary | ICD-10-CM | POA: Diagnosis not present

## 2021-09-17 ENCOUNTER — Ambulatory Visit (INDEPENDENT_AMBULATORY_CARE_PROVIDER_SITE_OTHER): Payer: PPO

## 2021-09-17 DIAGNOSIS — I495 Sick sinus syndrome: Secondary | ICD-10-CM

## 2021-09-17 LAB — CUP PACEART REMOTE DEVICE CHECK
Battery Remaining Longevity: 106 mo
Battery Remaining Percentage: 90 %
Battery Voltage: 3.01 V
Brady Statistic AP VP Percent: 1.4 %
Brady Statistic AP VS Percent: 38 %
Brady Statistic AS VP Percent: 1 %
Brady Statistic AS VS Percent: 60 %
Brady Statistic RA Percent Paced: 39 %
Brady Statistic RV Percent Paced: 1.4 %
Date Time Interrogation Session: 20230201020014
Implantable Lead Implant Date: 20210803
Implantable Lead Implant Date: 20210803
Implantable Lead Location: 753859
Implantable Lead Location: 753860
Implantable Pulse Generator Implant Date: 20210803
Lead Channel Impedance Value: 480 Ohm
Lead Channel Impedance Value: 530 Ohm
Lead Channel Pacing Threshold Amplitude: 0.5 V
Lead Channel Pacing Threshold Amplitude: 0.75 V
Lead Channel Pacing Threshold Pulse Width: 0.5 ms
Lead Channel Pacing Threshold Pulse Width: 0.5 ms
Lead Channel Sensing Intrinsic Amplitude: 12 mV
Lead Channel Sensing Intrinsic Amplitude: 5 mV
Lead Channel Setting Pacing Amplitude: 2 V
Lead Channel Setting Pacing Amplitude: 2.5 V
Lead Channel Setting Pacing Pulse Width: 0.5 ms
Lead Channel Setting Sensing Sensitivity: 2 mV
Pulse Gen Model: 2272
Pulse Gen Serial Number: 3851870

## 2021-09-24 NOTE — Progress Notes (Signed)
Remote pacemaker transmission.   

## 2021-10-14 DIAGNOSIS — E782 Mixed hyperlipidemia: Secondary | ICD-10-CM | POA: Diagnosis not present

## 2021-10-14 DIAGNOSIS — I10 Essential (primary) hypertension: Secondary | ICD-10-CM | POA: Diagnosis not present

## 2021-11-14 DIAGNOSIS — E785 Hyperlipidemia, unspecified: Secondary | ICD-10-CM | POA: Diagnosis not present

## 2021-11-14 DIAGNOSIS — I10 Essential (primary) hypertension: Secondary | ICD-10-CM | POA: Diagnosis not present

## 2021-12-17 ENCOUNTER — Ambulatory Visit (INDEPENDENT_AMBULATORY_CARE_PROVIDER_SITE_OTHER): Payer: PPO

## 2021-12-17 ENCOUNTER — Telehealth: Payer: Self-pay

## 2021-12-17 DIAGNOSIS — I495 Sick sinus syndrome: Secondary | ICD-10-CM

## 2021-12-17 LAB — CUP PACEART REMOTE DEVICE CHECK
Battery Remaining Longevity: 103 mo
Battery Remaining Percentage: 88 %
Battery Voltage: 3.01 V
Brady Statistic AP VP Percent: 1.3 %
Brady Statistic AP VS Percent: 41 %
Brady Statistic AS VP Percent: 1 %
Brady Statistic AS VS Percent: 58 %
Brady Statistic RA Percent Paced: 41 %
Brady Statistic RV Percent Paced: 1.4 %
Date Time Interrogation Session: 20230503023657
Implantable Lead Implant Date: 20210803
Implantable Lead Implant Date: 20210803
Implantable Lead Location: 753859
Implantable Lead Location: 753860
Implantable Pulse Generator Implant Date: 20210803
Lead Channel Impedance Value: 460 Ohm
Lead Channel Impedance Value: 490 Ohm
Lead Channel Pacing Threshold Amplitude: 0.5 V
Lead Channel Pacing Threshold Amplitude: 0.75 V
Lead Channel Pacing Threshold Pulse Width: 0.5 ms
Lead Channel Pacing Threshold Pulse Width: 0.5 ms
Lead Channel Sensing Intrinsic Amplitude: 12 mV
Lead Channel Sensing Intrinsic Amplitude: 5 mV
Lead Channel Setting Pacing Amplitude: 2 V
Lead Channel Setting Pacing Amplitude: 2.5 V
Lead Channel Setting Pacing Pulse Width: 0.5 ms
Lead Channel Setting Sensing Sensitivity: 2 mV
Pulse Gen Model: 2272
Pulse Gen Serial Number: 3851870

## 2021-12-17 NOTE — Telephone Encounter (Signed)
Called and spoke with patient, he is agreeable to appt 12/18/21 with [facility]; directions and phone number to clinic provided to patient. ?

## 2021-12-17 NOTE — Telephone Encounter (Signed)
Scheduled remote reviewed. Normal device function.   ?1 AHR 4/23 @ 03:15, EGM shows AF with controlled rates ?Burden <0.1, no OAC or hx of PAF noted ?Route to triage ?Next remote 91 days. ?LA ? ?Successful telephone encounter to patient to assess for s/s of new onset AT/AF. Patient states he was unaware of event. Denies triggers such as illness or cold medications. Patient is advised of referral to AF clinic for possible [place]. Routing to AF clinic for scheduling. ? ? ? ? ? ? ? ? ? ? ?

## 2021-12-18 ENCOUNTER — Encounter (HOSPITAL_COMMUNITY): Payer: Self-pay | Admitting: Physician Assistant

## 2021-12-18 ENCOUNTER — Ambulatory Visit (HOSPITAL_COMMUNITY)
Admission: RE | Admit: 2021-12-18 | Discharge: 2021-12-18 | Disposition: A | Discharge status: Home / Self Care | Payer: PPO | Source: Ambulatory Visit | Attending: Physician Assistant | Admitting: Physician Assistant

## 2021-12-18 VITALS — BP 166/92 | HR 62 | Ht 75.0 in | Wt 216.0 lb

## 2021-12-18 DIAGNOSIS — Z950 Presence of cardiac pacemaker: Secondary | ICD-10-CM | POA: Diagnosis not present

## 2021-12-18 DIAGNOSIS — Z7901 Long term (current) use of anticoagulants: Secondary | ICD-10-CM | POA: Insufficient documentation

## 2021-12-18 DIAGNOSIS — D6869 Other thrombophilia: Secondary | ICD-10-CM | POA: Diagnosis not present

## 2021-12-18 DIAGNOSIS — I10 Essential (primary) hypertension: Secondary | ICD-10-CM | POA: Diagnosis not present

## 2021-12-18 DIAGNOSIS — E785 Hyperlipidemia, unspecified: Secondary | ICD-10-CM | POA: Diagnosis not present

## 2021-12-18 DIAGNOSIS — I495 Sick sinus syndrome: Secondary | ICD-10-CM | POA: Insufficient documentation

## 2021-12-18 DIAGNOSIS — I480 Paroxysmal atrial fibrillation: Secondary | ICD-10-CM

## 2021-12-18 MED ORDER — APIXABAN 5 MG PO TABS: 5.0000 mg | ORAL_TABLET | Freq: Two times a day (BID) | ORAL | 3 refills | Status: DC

## 2021-12-18 NOTE — Progress Notes (Addendum)
? ? ?Primary Care Physician: Celene Squibb, MD ?Primary Electrophysiologist: Dr Rayann Heman ?Referring Physician: Dr Rayann Heman ? ? ?Ruben Lee is a 71 y.o. male with a history of SSS s/p PPM, HTN, HLD, atrial fibrillation who presents for follow up in the [facility].  The patient was initially diagnosed with atrial fibrillation on remote device interrogation with an episode on 12/09/21 which lasted 3 hours 41 minutes. He does state he felt "weird" at that time with some small "fluttering" in his chest. No other associated symptoms. Patient has a CHADS2VASC score of 2. He does snore but had a recent negative sleep study. No alcohol use.  ? ?Today, he denies symptoms of chest pain, shortness of breath, orthopnea, PND, lower extremity edema, dizziness, presyncope, syncope, daytime somnolence, bleeding, or neurologic sequela. The patient is tolerating medications without difficulties and is otherwise without complaint today. ? ? ?Atrial Fibrillation Risk Factors: ? ?he does not have symptoms or diagnosis of sleep apnea. ?Negative sleep study. ?he does have a history of rheumatic fever. ?he does have a history of alcohol use. ? ? ?he has a BMI of Body mass index is 27 kg/m?Marland KitchenMarland Kitchen ?Filed Weights  ? 12/18/21 0826  ?Weight: 98 kg  ? ? ?Family History  ?Problem Relation Age of Onset  ? Colon cancer Cousin   ?     "maybe a distant cousin"  ? COPD Mother   ? Heart attack Father   ? ? ? ?Atrial Fibrillation Management history: ? ?Previous antiarrhythmic drugs: none ?Previous cardioversions: none ?Previous ablations: none ?CHADS2VASC score: 2 ?Anticoagulation history: none ? ? ?Past Medical History:  ?Diagnosis Date  ? Hyperlipidemia   ? Hypertension   ? Sick sinus syndrome ([place])   ? a. s/p St. Jude PPM placement in 03/2020  ? ?Past Surgical History:  ?Procedure Laterality Date  ? COLONOSCOPY N/A 09/16/2015  ? Procedure: COLONOSCOPY;  Surgeon: Danie Binder, MD;  [facility];  Service: Endoscopy;   Laterality: N/A;  1230pm  ? COLONOSCOPY N/A 02/10/2019  ? Procedure: COLONOSCOPY;  Surgeon: Danie Binder, MD;  [facility];  Service: Endoscopy;  Laterality: N/A;  2:00  ? KNEE ARTHROSCOPY WITH MEDIAL MENISECTOMY Right 03/11/2016  ? Procedure: KNEE ARTHROSCOPY WITH MEDIAL MENISECTOMY;  Surgeon: Carole Civil, MD;  Location: [facility];  Service: Orthopedics;  Laterality: Right;  ? None to date    ? As of 08/29/15  ? PACEMAKER IMPLANT N/A 03/19/2020  ? Procedure: PACEMAKER IMPLANT;  Surgeon: Thompson Grayer, MD;  Location: [place] [facility];  Service: Cardiovascular;  Laterality: N/A;  ? POLYPECTOMY  02/10/2019  ? Procedure: POLYPECTOMY;  Surgeon: Danie Binder, MD;  [facility];  Service: Endoscopy;;  colon  ? ? ?Current Outpatient Medications  ?Medication Sig Dispense Refill  ? aspirin 81 MG tablet Take 81 mg by mouth daily.    ? cetirizine (ZYRTEC) 10 MG tablet Take 10 mg by mouth daily.     ? diclofenac Sodium (VOLTAREN) 1 % GEL Apply topically as needed (neck pain).    ? HYDROcodone-acetaminophen (NORCO) 7.5-325 MG tablet Take 1 tablet by mouth every 6 (six) hours.    ? lovastatin (MEVACOR) 10 MG tablet Take 10 mg by mouth at bedtime.   0  ? Multiple Vitamin (MULTIVITAMIN) capsule Take 1 capsule by mouth daily.    ? olmesartan (BENICAR) 20 MG tablet Take 20 mg by mouth daily.    ? ?No current facility-administered medications for  this encounter.  ? ? ?No Known Allergies ? ?Social History  ? ?Socioeconomic History  ? Marital status: Widowed  ?  Spouse name: Not on file  ? Number of children: Not on file  ? Years of education: Not on file  ? Highest education level: Not on file  ?Occupational History  ? Not on file  ?Tobacco Use  ? Smoking status: Former  ?  Packs/day: 1.00  ?  Years: 43.00  ?  Pack years: 43.00  ?  Types: Cigarettes  ?  Quit date: 07/18/2010  ?  Years since quitting: 11.4  ? Smokeless tobacco: Never  ? Tobacco comments:  ?  Former smoker 12/18/21  ?Vaping Use  ? Vaping Use:  Never used  ?Substance and Sexual Activity  ? Alcohol use: No  ?  Alcohol/week: 0.0 standard drinks  ?  Comment: None in 20 years  ? Drug use: No  ? Sexual activity: Not on file  ?Other Topics Concern  ? Not on file  ?Social History Narrative  ? Not on file  ? ?Social Determinants of Health  ? ?Financial Resource Strain: Not on file  ?Food Insecurity: Not on file  ?Transportation Needs: Not on file  ?Physical Activity: Not on file  ?Stress: Not on file  ?Social Connections: Not on file  ?Intimate Partner Violence: Not on file  ? ? ? ?ROS- All systems are reviewed and negative except as per the HPI above. ? ?Physical Exam: ?Vitals:  ? 12/18/21 0826  ?BP: (!) 166/92  ?Pulse: 62  ?Weight: 98 kg  ?Height: '6\' 3"'$  (1.905 m)  ? ? ?GEN- The patient is a well appearing male, alert and oriented x 3 today.   ?Head- normocephalic, atraumatic ?Eyes-  Sclera clear, conjunctiva pink ?Ears- hearing intact ?Oropharynx- clear ?Neck- supple  ?Lungs- Clear to ausculation bilaterally, normal work of breathing ?Heart- Regular rate and rhythm, no murmurs, rubs or gallops  ?GI- soft, NT, ND, + BS ?Extremities- no clubbing, cyanosis, or edema ?MS- no significant deformity or atrophy ?Skin- no rash or lesion ?Psych- euthymic mood, full affect ?Neuro- strength and sensation are intact ? ?Wt Readings from Last 3 Encounters:  ?12/18/21 98 kg  ?06/30/21 98.4 kg  ?07/03/20 97.4 kg  ? ? ?EKG today demonstrates  ?A paced rhythm  ?Vent. rate 62 BPM ?PR interval 234 ms ?QRS duration 96 ms ?QT/QTcB 378/383 ms ? ?Echo 12/28/19 demonstrated  ?1. Left ventricular ejection fraction, by estimation, is 60 to 65%. The  ?left ventricle has normal function. The left ventricle has no regional  ?wall motion abnormalities. Left ventricular diastolic parameters were  ?normal.  ? 2. Right ventricular systolic function is normal. The right ventricular  ?size is normal.  ? 3. The mitral valve is normal in structure. No evidence of mitral valve  ?regurgitation. No  evidence of mitral stenosis.  ? 4. The aortic valve is tricuspid. Aortic valve regurgitation is not  ?visualized. No aortic stenosis is present.  ? 5. The inferior vena cava is normal in size with greater than 50%  ?respiratory variability, suggesting right atrial pressure of 3 mmHg.  ? ?Epic records are reviewed at length today ? ?CHA2DS2-VASc Score = 2  ?The patient's score is based upon: ?CHF History: 0 ?HTN History: 1 ?Diabetes History: 0 ?Stroke History: 0 ?Vascular Disease History: 0 ?Age Score: 1 ?Gender Score: 0 ?    ? ? ?ASSESSMENT AND PLAN: ?1. Paroxysmal Atrial Fibrillation (ICD10:  I48.0) ?The patient's CHA2DS2-VASc score is 2, indicating a  2.2% annual risk of stroke.   ?General education about afib provided and questions answered. We also discussed his stroke risk and the risks and benefits of anticoagulation. ?Will stop ASA and start Eliquis 5 mg BID ?Will request recent labs from PCP office. ?Continue to monitor afib burden on device.  ? ?2. Secondary Hypercoagulable State (ICD10:  D68.69) ?The patient is at significant risk for stroke/thromboembolism based upon his CHA2DS2-VASc Score of 2.  Start Apixaban (Eliquis).  ? ?3. HTN ?Elevated today, h/o white coat HTN ?He brings in BP readings from home which show excellent control on olmesartan. No changes today.  ? ? ?Follow up in the AF clinic in 4-6 weeks.  ? ? ?Addendum: Labs from PCP office 09/12/21 ?Cr 0.93, BUN 14, K+ 4.9, AST 30, ALT 39 ?TSH 2.1 ?Hgb 15.3, Hct 44.2, WBC 5.8, PLT 168 ? ? ?Ricky Brenda Cowher PA-C ?Afib Clinic ?[facility] ?[address] ?[place], [place] [address] ?[phone] ?12/18/2021 ?8:36 AM ? ?

## 2021-12-18 NOTE — Addendum Note (Signed)
Encounter addended by: Oliver Barre, PA on: 12/18/2021 11:35 AM ? Actions taken: Clinical Note Signed

## 2021-12-18 NOTE — Patient Instructions (Signed)
Stop aspirin  Start Eliquis 5 mg twice a day.

## 2021-12-22 ENCOUNTER — Encounter: Payer: Self-pay | Admitting: *Deleted

## 2021-12-31 NOTE — Progress Notes (Signed)
Remote pacemaker transmission.   

## 2022-01-07 ENCOUNTER — Encounter: Payer: Self-pay | Admitting: Internal Medicine

## 2022-01-15 DIAGNOSIS — E1140 Type 2 diabetes mellitus with diabetic neuropathy, unspecified: Secondary | ICD-10-CM | POA: Diagnosis not present

## 2022-01-15 DIAGNOSIS — E782 Mixed hyperlipidemia: Secondary | ICD-10-CM | POA: Diagnosis not present

## 2022-01-20 DIAGNOSIS — I480 Paroxysmal atrial fibrillation: Secondary | ICD-10-CM | POA: Diagnosis not present

## 2022-01-20 DIAGNOSIS — E875 Hyperkalemia: Secondary | ICD-10-CM | POA: Diagnosis not present

## 2022-01-20 DIAGNOSIS — G43B0 Ophthalmoplegic migraine, not intractable: Secondary | ICD-10-CM | POA: Diagnosis not present

## 2022-01-20 DIAGNOSIS — Z87448 Personal history of other diseases of urinary system: Secondary | ICD-10-CM | POA: Diagnosis not present

## 2022-01-20 DIAGNOSIS — I10 Essential (primary) hypertension: Secondary | ICD-10-CM | POA: Diagnosis not present

## 2022-01-20 DIAGNOSIS — E1140 Type 2 diabetes mellitus with diabetic neuropathy, unspecified: Secondary | ICD-10-CM | POA: Diagnosis not present

## 2022-01-20 DIAGNOSIS — E782 Mixed hyperlipidemia: Secondary | ICD-10-CM | POA: Diagnosis not present

## 2022-01-20 DIAGNOSIS — R945 Abnormal results of liver function studies: Secondary | ICD-10-CM | POA: Diagnosis not present

## 2022-01-20 DIAGNOSIS — M5030 Other cervical disc degeneration, unspecified cervical region: Secondary | ICD-10-CM | POA: Diagnosis not present

## 2022-01-20 DIAGNOSIS — E041 Nontoxic single thyroid nodule: Secondary | ICD-10-CM | POA: Diagnosis not present

## 2022-01-20 DIAGNOSIS — M179 Osteoarthritis of knee, unspecified: Secondary | ICD-10-CM | POA: Diagnosis not present

## 2022-01-20 DIAGNOSIS — I495 Sick sinus syndrome: Secondary | ICD-10-CM | POA: Diagnosis not present

## 2022-01-21 NOTE — Progress Notes (Signed)
GI Office Note    Referring Provider: Celene Squibb, MD Primary Care Physician:  Celene Squibb, MD Primary GI: Dr. Abbey Chatters  Date:  01/26/2022  ID:  Ruben Lee, DOB 04-29-1951, MRN [id number]   Chief Complaint   Chief Complaint  Patient presents with   Colonoscopy     History of Present Illness  Ruben Lee is a 71 y.o. male with a history of HLD, HTN, sick sinus syndrome s/p pacemaker placed in 2021, and newly diagnosed Afib April 2023 on Eliquis since 12/18/21 presenting today for evaluation to schedule surveillance colonoscopy.  Last colonoscopy 02/10/2019 with three 3 to 4 mm polyps at the hepatic flexure and ascending colon, moderate diverticulosis in the rectosigmoid, sigmoid, descending colon, external and internal hemorrhoids, tortuous colon (pathology consistent with tubular adenomas).  Repeat in 3 years.   Today: Denies any change in bowel habits, constipation, diarrhea, melena, BRBPR, abdominal pain, weight loss, lack appetite, early satiety, reflux, or dysphagia.  Reports his reflux went away after he stopped smoking in 2010.  Thinks he had symptoms about a year ago regarding his A-fib. Reports he had a 4 hour time frame of fluttering feeling, not uncomfortable that was caught by his pacemaker. Exercises about an hour every morning (eliptical and fast walk). Denies edema. Denies any bleeding episodes on eliquis. Exercise used to curb his appetite but not lately.  Reports he has neck issues (got bad around easter last year). Had MRI C2-7 issues. When that happened he stated about a week after he had trouble with constipation (no urge to go). He took an otc stool softener/laxative (lasted for about 6 months). Has been good since then.    Past Medical History:  Diagnosis Date   Hyperlipidemia    Hypertension    Sick sinus syndrome ([place])    a. s/p St. Jude PPM placement in 03/2020    Past Surgical History:  Procedure Laterality Date   COLONOSCOPY N/A 09/16/2015    Procedure: COLONOSCOPY;  Surgeon: Danie Binder, MD;  [facility];  Service: Endoscopy;  Laterality: N/A;  1230pm   COLONOSCOPY N/A 02/10/2019   Procedure: COLONOSCOPY;  Surgeon: Danie Binder, MD;  [facility];  Service: Endoscopy;  Laterality: N/A;  2:00   KNEE ARTHROSCOPY WITH MEDIAL MENISECTOMY Right 03/11/2016   Procedure: KNEE ARTHROSCOPY WITH MEDIAL MENISECTOMY;  Surgeon: Carole Civil, MD;  Location: [facility];  Service: Orthopedics;  Laterality: Right;   None to date     As of 08/29/15   PACEMAKER IMPLANT N/A 03/19/2020   Procedure: PACEMAKER IMPLANT;  Surgeon: Thompson Grayer, MD;  Location: [place] CV LAB;  Service: Cardiovascular;  Laterality: N/A;   POLYPECTOMY  02/10/2019   Procedure: POLYPECTOMY;  Surgeon: Danie Binder, MD;  [facility];  Service: Endoscopy;;  colon    Current Outpatient Medications  Medication Sig Dispense Refill   apixaban (ELIQUIS) 5 MG TABS tablet Take 1 tablet (5 mg total) by mouth 2 (two) times daily. 60 tablet 3   cetirizine (ZYRTEC) 10 MG tablet Take 10 mg by mouth daily.      HYDROcodone-acetaminophen (NORCO) 7.5-325 MG tablet Take 1 tablet by mouth every 6 (six) hours.     lovastatin (MEVACOR) 10 MG tablet Take 10 mg by mouth at bedtime.   0   Multiple Vitamin (MULTIVITAMIN) capsule Take 1 capsule by mouth daily.     olmesartan (BENICAR) 20 MG tablet Take 20 mg by mouth daily.  diclofenac Sodium (VOLTAREN) 1 % GEL Apply topically as needed (neck pain). (Patient not taking: Reported on 01/26/2022)     No current facility-administered medications for this visit.    Allergies as of 01/26/2022   (No Known Allergies)    Family History  Problem Relation Age of Onset   Colon cancer Cousin        "maybe a distant cousin"   COPD Mother    Heart attack Father     Social History   Socioeconomic History   Marital status: Widowed    Spouse name: Not on file   Number of children: Not on file   Years of  education: Not on file   Highest education level: Not on file  Occupational History   Not on file  Tobacco Use   Smoking status: Former    Packs/day: 1.00    Years: 43.00    Total pack years: 43.00    Types: Cigarettes    Quit date: 07/18/2010    Years since quitting: 11.5   Smokeless tobacco: Never   Tobacco comments:    Former smoker 12/18/21  Vaping Use   Vaping Use: Never used  Substance and Sexual Activity   Alcohol use: No    Alcohol/week: 0.0 standard drinks of alcohol    Comment: None in 20 years   Drug use: No   Sexual activity: Not on file  Other Topics Concern   Not on file  Social History Narrative   Not on file   Social Determinants of Health   Financial Resource Strain: Not on file  Food Insecurity: Not on file  Transportation Needs: Not on file  Physical Activity: Not on file  Stress: Not on file  Social Connections: Not on file     Review of Systems   Gen: Denies fever, chills, anorexia. Denies fatigue, weakness, weight loss.  CV: Denies chest pain, palpitations, syncope, peripheral edema, and claudication. Resp: Denies dyspnea at rest, cough, wheezing, coughing up blood, and pleurisy. GI: see HPI Derm: Denies rash, itching, dry skin Psych: Denies depression, anxiety, memory loss, confusion. No homicidal or suicidal ideation.  Heme: Denies bruising, bleeding, and enlarged lymph nodes.   Physical Exam   BP (!) 142/82   Pulse 86   Temp 97.6 F (36.4 C) (Temporal)   Ht '6\' 3"'$  (1.905 m)   Wt 217 lb 3.2 oz (98.5 kg)   BMI 27.15 kg/m   General:   Alert and oriented. No distress noted. Pleasant and cooperative.  Head:  Normocephalic and atraumatic. Eyes:  Conjuctiva clear without scleral icterus. Lungs:  Clear to auscultation bilaterally. No wheezes, rales, or rhonchi. No distress.  Heart: Irregular rhythm, rate controlled Abdomen:  +BS, soft, non-tender and non-distended. No rebound or guarding. No HSM or masses noted. Rectal: deferred Msk:   Symmetrical without gross deformities. Normal posture. Extremities:  Without edema. Neurologic:  Alert and  oriented x4 Psych:  Alert and cooperative. Normal mood and affect.   Assessment  Ruben Lee is a 71 y.o. male with a history of HTN, HLD, prediabetes (last hemoglobin A1c 6.3), SSS s/p pacemaker placement in 2021, Afib newly diagnosed April 2023 presenting today to schedule surveillance colonoscopy.  History of colon polyps: Last colonoscopy June 2020 with 3 tubular adenomatous polyps removed.  Denies any upper or lower GI symptoms.  No alarm symptoms present.  Since his last colonoscopy he has had a pacemaker placed and was newly diagnosed with A-fib in April 2023, discussed that we would  need to get cardiac clearance to hold his Eliquis for 48 hours prior to his procedure before scheduling.  Patient reports that he had Suprep as his last bowel prep and would prefer that despite having the extra cost.   PLAN   Proceed with colonoscopy with propofol by Dr. Abbey Chatters in near future: the risks, benefits, and alternatives have been discussed with the patient in detail. The patient states understanding and desires to proceed. ASA 3 Cardiac clearance to hold Eliquis for 48 hours prior to procedure Suprep bowel prep   Venetia Night, MSN, FNP-BC, AGACNP-BC [facility]

## 2022-01-21 NOTE — H&P (View-Only) (Signed)
GI Office Note    Referring Provider: Celene Squibb, MD Primary Care Physician:  Celene Squibb, MD Primary GI: Dr. Abbey Chatters  Date:  01/26/2022  ID:  [name], DOB [birth year], MRN [id number]   Chief Complaint   Chief Complaint  Patient presents with   Colonoscopy     History of Present Illness  Ruben Lee is a 71 y.o. male with a history of HLD, HTN, sick sinus syndrome s/p pacemaker placed in 2021, and newly diagnosed Afib April 2023 on Eliquis since 12/18/21 presenting today for evaluation to schedule surveillance colonoscopy.  Last colonoscopy 02/10/2019 with three 3 to 4 mm polyps at the hepatic flexure and ascending colon, moderate diverticulosis in the rectosigmoid, sigmoid, descending colon, external and internal hemorrhoids, tortuous colon (pathology consistent with tubular adenomas).  Repeat in 3 years.   Today: Denies any change in bowel habits, constipation, diarrhea, melena, BRBPR, abdominal pain, weight loss, lack appetite, early satiety, reflux, or dysphagia.  Reports his reflux went away after he stopped smoking in 2010.  Thinks he had symptoms about a year ago regarding his A-fib. Reports he had a 4 hour time frame of fluttering feeling, not uncomfortable that was caught by his pacemaker. Exercises about an hour every morning (eliptical and fast walk). Denies edema. Denies any bleeding episodes on eliquis. Exercise used to curb his appetite but not lately.  Reports he has neck issues (got bad around easter last year). Had MRI C2-7 issues. When that happened he stated about a week after he had trouble with constipation (no urge to go). He took an otc stool softener/laxative (lasted for about 6 months). Has been good since then.    Past Medical History:  Diagnosis Date   Hyperlipidemia    Hypertension    Sick sinus syndrome ([facility])    a. s/p St. Jude PPM placement in 03/2020    Past Surgical History:  Procedure Laterality Date   COLONOSCOPY N/A 09/16/2015    Procedure: COLONOSCOPY;  Surgeon: Danie Binder, MD;  [facility];  Service: Endoscopy;  Laterality: N/A;  1230pm   COLONOSCOPY N/A 02/10/2019   Procedure: COLONOSCOPY;  Surgeon: Danie Binder, MD;  [facility];  Service: Endoscopy;  Laterality: N/A;  2:00   KNEE ARTHROSCOPY WITH MEDIAL MENISECTOMY Right 03/11/2016   Procedure: KNEE ARTHROSCOPY WITH MEDIAL MENISECTOMY;  Surgeon: Carole Civil, MD;  Location: [facility];  Service: Orthopedics;  Laterality: Right;   None to date     As of 08/29/15   PACEMAKER IMPLANT N/A 03/19/2020   Procedure: PACEMAKER IMPLANT;  Surgeon: Thompson Grayer, MD;  Location: [place] [facility];  Service: Cardiovascular;  Laterality: N/A;   POLYPECTOMY  02/10/2019   Procedure: POLYPECTOMY;  Surgeon: Danie Binder, MD;  [facility];  Service: Endoscopy;;  colon    Current Outpatient Medications  Medication Sig Dispense Refill   apixaban (ELIQUIS) 5 MG TABS tablet Take 1 tablet (5 mg total) by mouth 2 (two) times daily. 60 tablet 3   cetirizine (ZYRTEC) 10 MG tablet Take 10 mg by mouth daily.      HYDROcodone-acetaminophen (NORCO) 7.5-325 MG tablet Take 1 tablet by mouth every 6 (six) hours.     lovastatin (MEVACOR) 10 MG tablet Take 10 mg by mouth at bedtime.   0   Multiple Vitamin (MULTIVITAMIN) capsule Take 1 capsule by mouth daily.     olmesartan (BENICAR) 20 MG tablet Take 20 mg by mouth daily.  diclofenac Sodium (VOLTAREN) 1 % GEL Apply topically as needed (neck pain). (Patient not taking: Reported on 01/26/2022)     No current facility-administered medications for this visit.    Allergies as of 01/26/2022   (No Known Allergies)    Family History  Problem Relation Age of Onset   Colon cancer Cousin        "maybe a distant cousin"   COPD Mother    Heart attack Father     Social History   Socioeconomic History   Marital status: Widowed    Spouse name: Not on file   Number of children: Not on file   Years of  education: Not on file   Highest education level: Not on file  Occupational History   Not on file  Tobacco Use   Smoking status: Former    Packs/day: 1.00    Years: 43.00    Total pack years: 43.00    Types: Cigarettes    Quit date: 07/18/2010    Years since quitting: 11.5   Smokeless tobacco: Never   Tobacco comments:    Former smoker 12/18/21  Vaping Use   Vaping Use: Never used  Substance and Sexual Activity   Alcohol use: No    Alcohol/week: 0.0 standard drinks of alcohol    Comment: None in 20 years   Drug use: No   Sexual activity: Not on file  Other Topics Concern   Not on file  Social History Narrative   Not on file   Social Determinants of Health   Financial Resource Strain: Not on file  Food Insecurity: Not on file  Transportation Needs: Not on file  Physical Activity: Not on file  Stress: Not on file  Social Connections: Not on file     Review of Systems   Gen: Denies fever, chills, anorexia. Denies fatigue, weakness, weight loss.  CV: Denies chest pain, palpitations, syncope, peripheral edema, and claudication. Resp: Denies dyspnea at rest, cough, wheezing, coughing up blood, and pleurisy. GI: see HPI Derm: Denies rash, itching, dry skin Psych: Denies depression, anxiety, memory loss, confusion. No homicidal or suicidal ideation.  Heme: Denies bruising, bleeding, and enlarged lymph nodes.   Physical Exam   BP (!) 142/82   Pulse 86   Temp 97.6 F (36.4 C) (Temporal)   Ht '6\' 3"'$  (1.905 m)   Wt 217 lb 3.2 oz (98.5 kg)   BMI 27.15 kg/m   General:   Alert and oriented. No distress noted. Pleasant and cooperative.  Head:  Normocephalic and atraumatic. Eyes:  Conjuctiva clear without scleral icterus. Lungs:  Clear to auscultation bilaterally. No wheezes, rales, or rhonchi. No distress.  Heart: Irregular rhythm, rate controlled Abdomen:  +BS, soft, non-tender and non-distended. No rebound or guarding. No HSM or masses noted. Rectal: deferred Msk:   Symmetrical without gross deformities. Normal posture. Extremities:  Without edema. Neurologic:  Alert and  oriented x4 Psych:  Alert and cooperative. Normal mood and affect.   Assessment  Ruben Lee is a 71 y.o. male with a history of HTN, HLD, prediabetes (last hemoglobin A1c 6.3), SSS s/p pacemaker placement in 2021, Afib newly diagnosed April 2023 presenting today to schedule surveillance colonoscopy.  History of colon polyps: Last colonoscopy June 2020 with 3 tubular adenomatous polyps removed.  Denies any upper or lower GI symptoms.  No alarm symptoms present.  Since his last colonoscopy he has had a pacemaker placed and was newly diagnosed with A-fib in April 2023, discussed that we would  need to get cardiac clearance to hold his Eliquis for 48 hours prior to his procedure before scheduling.  Patient reports that he had Suprep as his last bowel prep and would prefer that despite having the extra cost.   PLAN   Proceed with colonoscopy with propofol by Dr. Abbey Chatters in near future: the risks, benefits, and alternatives have been discussed with the patient in detail. The patient states understanding and desires to proceed. ASA 3 Cardiac clearance to hold Eliquis for 48 hours prior to procedure Suprep bowel prep   Venetia Night, MSN, FNP-BC, AGACNP-BC [facility]

## 2022-01-26 ENCOUNTER — Encounter: Payer: Self-pay | Admitting: Gastroenterology

## 2022-01-26 ENCOUNTER — Encounter: Payer: Self-pay | Admitting: *Deleted

## 2022-01-26 ENCOUNTER — Telehealth: Payer: Self-pay | Admitting: *Deleted

## 2022-01-26 ENCOUNTER — Ambulatory Visit (INDEPENDENT_AMBULATORY_CARE_PROVIDER_SITE_OTHER): Payer: PPO | Admitting: Gastroenterology

## 2022-01-26 VITALS — BP 142/82 | HR 86 | Temp 97.6°F | Ht 75.0 in | Wt 217.2 lb

## 2022-01-26 DIAGNOSIS — I4891 Unspecified atrial fibrillation: Secondary | ICD-10-CM

## 2022-01-26 DIAGNOSIS — Z86010 Personal history of colonic polyps: Secondary | ICD-10-CM | POA: Diagnosis not present

## 2022-01-26 MED ORDER — NA SULFATE-K SULFATE-MG SULF 17.5-3.13-1.6 GM/177ML PO SOLN: 1.0000 | Freq: Once | ORAL | 0 refills | Status: AC

## 2022-01-26 NOTE — Telephone Encounter (Signed)
CALLED PT. He has been scheduled for 7/10 at 12:30pm. Aware will mail instructions/pre-op appt. Rx will be sent. He wants suprep and will pay out of pocket. Aware to hold eliquis 2 days prior.

## 2022-01-26 NOTE — Patient Instructions (Signed)
We are scheduling you for a colonoscopy in the near future with Dr. Abbey Chatters.   We are requesting cardiac clearance to hold your Eliquis for 48 hours prior to your procedure. Our scheduling team will contact you once that is received to get you scheduled.   It was a pleasure to meet you today!  I want to create trusting relationships with patients. If you receive a survey regarding your visit,  I greatly appreciate you taking time to fill this out on paper or through your MyChart. I value your feedback.  Venetia Night, MSN, FNP-BC, AGACNP-BC [facility]

## 2022-01-26 NOTE — Addendum Note (Signed)
Addended by: Cheron Every on: 01/26/2022 04:35 PM   Modules accepted: Orders

## 2022-01-26 NOTE — Telephone Encounter (Signed)
Sent medication clearance. Waiting on approval.

## 2022-01-26 NOTE — Telephone Encounter (Signed)
Received medication clearance. Copy placed on providers desk.  

## 2022-01-26 NOTE — Telephone Encounter (Signed)
Attention: Preop   We would like to request holding the following medication for patient please.  Procedure: Colonoscopy   Date: TBD  Medication to hold:  Eliquis for 2 days prior to procedure   Surgeon: Dr. Abbey Chatters   Phone: [phone]  Fax:  [phone]  Type of Anesthesia:  Propofol  ASA III    Please call office if any questions.   Thank you,  Webb Silversmith, [place]

## 2022-01-27 NOTE — Progress Notes (Signed)
Primary Care Physician: Benita Stabile, MD Primary Electrophysiologist: Dr Johney Frame Referring Physician: Dr Patric Dykes is a 71 y.o. male with a history of SSS s/p PPM, HTN, HLD, atrial fibrillation who presents for follow up in the [facility].  The patient was initially diagnosed with atrial fibrillation on remote device interrogation with an episode on 12/09/21 which lasted 3 hours 41 minutes. He does state he felt "weird" at that time with some small "fluttering" in his chest. No other associated symptoms. Patient has a CHADS2VASC score of 2.  On follow up today, patient reports that he has done well since his last visit. No palpitations. He denies any bleeding issues since starting anticoagulation.   Today, he denies symptoms of palpitations, chest pain, shortness of breath, orthopnea, PND, lower extremity edema, dizziness, presyncope, syncope, daytime somnolence, bleeding, or neurologic sequela. The patient is tolerating medications without difficulties and is otherwise without complaint today.   Atrial Fibrillation Risk Factors:  he does not have symptoms or diagnosis of sleep apnea. Negative sleep study. he does have a history of rheumatic fever. he does have a history of alcohol use.   he has a BMI of Body mass index is 27.2 kg/m.Marland Kitchen Filed Weights   01/28/22 0827  Weight: 98.7 kg     Family History  Problem Relation Age of Onset   Colon cancer Cousin        "maybe a distant cousin"   COPD Mother    Heart attack Father      Atrial Fibrillation Management history:  Previous antiarrhythmic drugs: none Previous cardioversions: none Previous ablations: none CHADS2VASC score: 2 Anticoagulation history: Eliquis   Past Medical History:  Diagnosis Date   Hyperlipidemia    Hypertension    Sick sinus syndrome ([facility])    a. s/p St. Jude PPM placement in 03/2020   Past Surgical History:  Procedure Laterality Date   COLONOSCOPY  N/A 09/16/2015   Procedure: COLONOSCOPY;  Surgeon: West Bali, MD;  [facility];  Service: Endoscopy;  Laterality: N/A;  1230pm   COLONOSCOPY N/A 02/10/2019   Procedure: COLONOSCOPY;  Surgeon: West Bali, MD;  [facility];  Service: Endoscopy;  Laterality: N/A;  2:00   KNEE ARTHROSCOPY WITH MEDIAL MENISECTOMY Right 03/11/2016   Procedure: KNEE ARTHROSCOPY WITH MEDIAL MENISECTOMY;  Surgeon: Vickki Hearing, MD;  Location: [facility];  Service: Orthopedics;  Laterality: Right;   None to date     As of 08/29/15   PACEMAKER IMPLANT N/A 03/19/2020   Procedure: PACEMAKER IMPLANT;  Surgeon: Hillis Range, MD;  [facility];  Service: Cardiovascular;  Laterality: N/A;   POLYPECTOMY  02/10/2019   Procedure: POLYPECTOMY;  Surgeon: West Bali, MD;  [facility];  Service: Endoscopy;;  colon    Current Outpatient Medications  Medication Sig Dispense Refill   apixaban (ELIQUIS) 5 MG TABS tablet Take 1 tablet (5 mg total) by mouth 2 (two) times daily. 60 tablet 3   cetirizine (ZYRTEC) 10 MG tablet Take 10 mg by mouth daily.      diclofenac Sodium (VOLTAREN) 1 % GEL Apply topically as needed (neck pain).     HYDROcodone-acetaminophen (NORCO) 7.5-325 MG tablet Take 1 tablet by mouth every 6 (six) hours.     lovastatin (MEVACOR) 10 MG tablet Take 10 mg by mouth at bedtime.   0   Multiple Vitamin (MULTIVITAMIN) capsule Take 1 capsule by mouth daily.  olmesartan (BENICAR) 20 MG tablet Take 20 mg by mouth daily.     No current facility-administered medications for this encounter.    No Known Allergies  Social History   Socioeconomic History   Marital status: Widowed    Spouse name: Not on file   Number of children: Not on file   Years of education: Not on file   Highest education level: Not on file  Occupational History   Not on file  Tobacco Use   Smoking status: Former    Packs/day: 1.00    Years: 43.00    Total pack years: 43.00     Types: Cigarettes    Quit date: 07/18/2010    Years since quitting: 11.5   Smokeless tobacco: Never   Tobacco comments:    Former smoker 12/18/21  Vaping Use   Vaping Use: Never used  Substance and Sexual Activity   Alcohol use: No    Alcohol/week: 0.0 standard drinks of alcohol    Comment: None in 20 years   Drug use: No   Sexual activity: Not on file  Other Topics Concern   Not on file  Social History Narrative   Not on file   Social Determinants of Health   Financial Resource Strain: Not on file  Food Insecurity: Not on file  Transportation Needs: Not on file  Physical Activity: Not on file  Stress: Not on file  Social Connections: Not on file  Intimate Partner Violence: Not on file     ROS- All systems are reviewed and negative except as per the HPI above.  Physical Exam: Vitals:   01/28/22 0827  BP: (!) 160/90  Pulse: 64  Weight: 98.7 kg  Height: 6\' 3"  (1.905 m)     GEN- The patient is a well appearing male, alert and oriented x 3 today.   HEENT-head normocephalic, atraumatic, sclera clear, conjunctiva pink, hearing intact, trachea midline. Lungs- Clear to ausculation bilaterally, normal work of breathing Heart- Regular rate and rhythm, no murmurs, rubs or gallops  GI- soft, NT, ND, + BS Extremities- no clubbing, cyanosis, or edema MS- no significant deformity or atrophy Skin- no rash or lesion Psych- euthymic mood, full affect Neuro- strength and sensation are intact   Wt Readings from Last 3 Encounters:  01/28/22 98.7 kg  01/26/22 98.5 kg  12/18/21 98 kg    EKG today demonstrates  SR, 1st degree AV block Vent. rate 64 BPM PR interval 204 ms QRS duration 94 ms QT/QTcB 384/396 ms  Echo 12/28/19 demonstrated  1. Left ventricular ejection fraction, by estimation, is 60 to 65%. The  left ventricle has normal function. The left ventricle has no regional  wall motion abnormalities. Left ventricular diastolic parameters were  normal.   2. Right  ventricular systolic function is normal. The right ventricular  size is normal.   3. The mitral valve is normal in structure. No evidence of mitral valve  regurgitation. No evidence of mitral stenosis.   4. The aortic valve is tricuspid. Aortic valve regurgitation is not  visualized. No aortic stenosis is present.   5. The inferior vena cava is normal in size with greater than 50%  respiratory variability, suggesting right atrial pressure of 3 mmHg.   Epic records are reviewed at length today  CHA2DS2-VASc Score = 2  The patient's score is based upon: CHF History: 0 HTN History: 1 Diabetes History: 0 Stroke History: 0 Vascular Disease History: 0 Age Score: 1 Gender Score: 0  ASSESSMENT AND PLAN: 1. Paroxysmal Atrial Fibrillation (ICD10:  I48.0) The patient's CHA2DS2-VASc score is 2, indicating a 2.2% annual risk of stroke.   Patient appears to be maintaining SR. Continue Eliquis 5 mg BID Will request recent lab work from PCP. Continue to monitor afib burden on device.   2. Secondary Hypercoagulable State (ICD10:  D68.69) The patient is at significant risk for stroke/thromboembolism based upon his CHA2DS2-VASc Score of 2.  Continue Apixaban (Eliquis).   3. HTN H/o white coat HTN Home readings within normal limits. No changes today.   Follow up with Francis Dowse per recall.    Jorja Loa PA-C [facility] [address] [place], [place] [address] [phone] 01/28/2022 8:45 AM

## 2022-01-28 ENCOUNTER — Encounter (HOSPITAL_COMMUNITY): Payer: Self-pay | Admitting: Physician Assistant

## 2022-01-28 ENCOUNTER — Ambulatory Visit (HOSPITAL_COMMUNITY)
Admission: RE | Admit: 2022-01-28 | Discharge: 2022-01-28 | Disposition: A | Discharge status: Home / Self Care | Payer: PPO | Source: Ambulatory Visit | Attending: Physician Assistant | Admitting: Physician Assistant

## 2022-01-28 VITALS — BP 160/90 | HR 64 | Ht 75.0 in | Wt 217.6 lb

## 2022-01-28 DIAGNOSIS — E785 Hyperlipidemia, unspecified: Secondary | ICD-10-CM | POA: Diagnosis not present

## 2022-01-28 DIAGNOSIS — I480 Paroxysmal atrial fibrillation: Secondary | ICD-10-CM | POA: Insufficient documentation

## 2022-01-28 DIAGNOSIS — I10 Essential (primary) hypertension: Secondary | ICD-10-CM | POA: Insufficient documentation

## 2022-01-28 DIAGNOSIS — Z950 Presence of cardiac pacemaker: Secondary | ICD-10-CM | POA: Insufficient documentation

## 2022-01-28 DIAGNOSIS — D6869 Other thrombophilia: Secondary | ICD-10-CM | POA: Diagnosis not present

## 2022-01-28 DIAGNOSIS — I495 Sick sinus syndrome: Secondary | ICD-10-CM | POA: Insufficient documentation

## 2022-01-28 DIAGNOSIS — Z7901 Long term (current) use of anticoagulants: Secondary | ICD-10-CM | POA: Diagnosis not present

## 2022-02-12 ENCOUNTER — Other Ambulatory Visit (HOSPITAL_COMMUNITY): Payer: Self-pay | Admitting: Physician Assistant

## 2022-02-13 DIAGNOSIS — E782 Mixed hyperlipidemia: Secondary | ICD-10-CM | POA: Diagnosis not present

## 2022-02-13 DIAGNOSIS — I480 Paroxysmal atrial fibrillation: Secondary | ICD-10-CM | POA: Diagnosis not present

## 2022-02-13 DIAGNOSIS — E1140 Type 2 diabetes mellitus with diabetic neuropathy, unspecified: Secondary | ICD-10-CM | POA: Diagnosis not present

## 2022-02-13 DIAGNOSIS — I10 Essential (primary) hypertension: Secondary | ICD-10-CM | POA: Diagnosis not present

## 2022-02-18 NOTE — Patient Instructions (Signed)
Ruben Lee  02/18/2022     '@PREFPERIOPPHARMACY'$ @   Your procedure is scheduled on  02/23/2022.   Report to [facility] at  1030  A.M.   Call this number if you have problems the morning of surgery:  [phone]   Remember:  Follow the diet and prep instructions given to you by the office.    Your last dose of eliquis should be on 02/20/2022.     Take these medicines the morning of surgery with A SIP OF WATER                           zyrtec, norco(if needed).     Do not wear jewelry, make-up or nail polish.  Do not wear lotions, powders, or perfumes, or deodorant.  Do not shave 48 hours prior to surgery.  Men may shave face and neck.  Do not bring valuables to the hospital.  [facility] is not responsible for any belongings or valuables.  Contacts, dentures or bridgework may not be worn into surgery.  Leave your suitcase in the car.  After surgery it may be brought to your room.  For patients admitted to the hospital, discharge time will be determined by your treatment team.  Patients discharged the day of surgery will not be allowed to drive home and must have someone with them for 24 hours.    Special instructions:   DO NOT smoke tobacco or vape for 24 hours before your procedure.  Please read over the following fact sheets that you were given. Anesthesia Post-op Instructions and Care and Recovery After Surgery      Colonoscopy, Adult, Care After The following information offers guidance on how to care for yourself after your procedure. Your health care provider may also give you more specific instructions. If you have problems or questions, contact your health care provider. What can I expect after the procedure? After the procedure, it is common to have: A small amount of blood in your stool for 24 hours after the procedure. Some gas. Mild cramping or bloating of your abdomen. Follow these instructions at home: Eating and drinking  Drink enough  fluid to keep your urine pale yellow. Follow instructions from your health care provider about eating or drinking restrictions. Resume your normal diet as told by your health care provider. Avoid heavy or fried foods that are hard to digest. Activity Rest as told by your health care provider. Avoid sitting for a long time without moving. Get up to take short walks every 1-2 hours. This is important to improve blood flow and breathing. Ask for help if you feel weak or unsteady. Return to your normal activities as told by your health care provider. Ask your health care provider what activities are safe for you. Managing cramping and bloating  Try walking around when you have cramps or feel bloated. If directed, apply heat to your abdomen as told by your health care provider. Use the heat source that your health care provider recommends, such as a moist heat pack or a heating pad. Place a towel between your skin and the heat source. Leave the heat on for 20-30 minutes. Remove the heat if your skin turns bright red. This is especially important if you are unable to feel pain, heat, or cold. You have a greater risk of getting burned. General instructions If you were given a sedative during the procedure, it can  affect you for several hours. Do not drive or operate machinery until your health care provider says that it is safe. For the first 24 hours after the procedure: Do not sign important documents. Do not drink alcohol. Do your regular daily activities at a slower pace than normal. Eat soft foods that are easy to digest. Take over-the-counter and prescription medicines only as told by your health care provider. Keep all follow-up visits. This is important. Contact a health care provider if: You have blood in your stool 2-3 days after the procedure. Get help right away if: You have more than a small spotting of blood in your stool. You have large blood clots in your stool. You have swelling  of your abdomen. You have nausea or vomiting. You have a fever. You have increasing pain in your abdomen that is not relieved with medicine. These symptoms may be an emergency. Get help right away. Call 911. Do not wait to see if the symptoms will go away. Do not drive yourself to the hospital. Summary After the procedure, it is common to have a small amount of blood in your stool. You may also have mild cramping and bloating of your abdomen. If you were given a sedative during the procedure, it can affect you for several hours. Do not drive or operate machinery until your health care provider says that it is safe. Get help right away if you have a lot of blood in your stool, nausea or vomiting, a fever, or increased pain in your abdomen. This information is not intended to replace advice given to you by your health care provider. Make sure you discuss any questions you have with your health care provider. Document Revised: 03/26/2021 Document Reviewed: 03/26/2021 Elsevier Patient Education  [place] After This sheet gives you information about how to care for yourself after your procedure. Your health care provider may also give you more specific instructions. If you have problems or questions, contact your health care provider. What can I expect after the procedure? After the procedure, it is common to have: Tiredness. Forgetfulness about what happened after the procedure. Impaired judgment for important decisions. Nausea or vomiting. Some difficulty with balance. Follow these instructions at home: For the time period you were told by your health care provider:     Rest as needed. Do not participate in activities where you could fall or become injured. Do not drive or use machinery. Do not drink alcohol. Do not take sleeping pills or medicines that cause drowsiness. Do not make important decisions or sign legal documents. Do not take care  of children on your own. Eating and drinking Follow the diet that is recommended by your health care provider. Drink enough fluid to keep your urine pale yellow. If you vomit: Drink water, juice, or soup when you can drink without vomiting. Make sure you have little or no nausea before eating solid foods. General instructions Have a responsible adult stay with you for the time you are told. It is important to have someone help care for you until you are awake and alert. Take over-the-counter and prescription medicines only as told by your health care provider. If you have sleep apnea, surgery and certain medicines can increase your risk for breathing problems. Follow instructions from your health care provider about wearing your sleep device: Anytime you are sleeping, including during daytime naps. While taking prescription pain medicines, sleeping medicines, or medicines that make you drowsy. Avoid smoking.  Keep all follow-up visits as told by your health care provider. This is important. Contact a health care provider if: You keep feeling nauseous or you keep vomiting. You feel light-headed. You are still sleepy or having trouble with balance after 24 hours. You develop a rash. You have a fever. You have redness or swelling around the IV site. Get help right away if: You have trouble breathing. You have new-onset confusion at home. Summary For several hours after your procedure, you may feel tired. You may also be forgetful and have poor judgment. Have a responsible adult stay with you for the time you are told. It is important to have someone help care for you until you are awake and alert. Rest as told. Do not drive or operate machinery. Do not drink alcohol or take sleeping pills. Get help right away if you have trouble breathing, or if you suddenly become confused. This information is not intended to replace advice given to you by your health care provider. Make sure you discuss  any questions you have with your health care provider. Document Revised: 07/08/2021 Document Reviewed: 07/06/2019 Elsevier Patient Education  [place].

## 2022-02-19 ENCOUNTER — Encounter (HOSPITAL_COMMUNITY): Payer: Self-pay

## 2022-02-19 ENCOUNTER — Encounter (HOSPITAL_COMMUNITY)
Admission: RE | Admit: 2022-02-19 | Discharge: 2022-02-19 | Disposition: A | Discharge status: Home / Self Care | Payer: PPO | Source: Ambulatory Visit | Attending: Internal Medicine | Admitting: Internal Medicine

## 2022-02-19 DIAGNOSIS — Z01812 Encounter for preprocedural laboratory examination: Secondary | ICD-10-CM | POA: Insufficient documentation

## 2022-02-19 DIAGNOSIS — E875 Hyperkalemia: Secondary | ICD-10-CM | POA: Insufficient documentation

## 2022-02-19 HISTORY — DX: Unspecified atrial fibrillation: I48.91

## 2022-02-19 HISTORY — DX: Chronic obstructive pulmonary disease, unspecified: J44.9

## 2022-02-19 HISTORY — DX: Presence of cardiac pacemaker: Z95.0

## 2022-02-19 HISTORY — DX: Cardiac arrhythmia, unspecified: I49.9

## 2022-02-19 HISTORY — DX: Prediabetes: R73.03

## 2022-02-19 LAB — BASIC METABOLIC PANEL
Anion gap: 8 (ref 5–15)
BUN: 17 mg/dL (ref 8–23)
CO2: 23 mmol/L (ref 22–32)
Calcium: 9.4 mg/dL (ref 8.9–10.3)
Chloride: 109 mmol/L (ref 98–111)
Creatinine, Ser: 0.85 mg/dL (ref 0.61–1.24)
GFR, Estimated: >60 mL/min (ref >60)
Glucose, Bld: 112 mg/dL — ABNORMAL HIGH (ref 70–99)
Potassium: 4 mmol/L (ref 3.5–5.1)
Sodium: 140 mmol/L (ref 135–145)

## 2022-02-23 ENCOUNTER — Ambulatory Visit (HOSPITAL_COMMUNITY): Payer: PPO | Admitting: Anesthesiology

## 2022-02-23 ENCOUNTER — Other Ambulatory Visit: Payer: Self-pay

## 2022-02-23 ENCOUNTER — Ambulatory Visit (HOSPITAL_COMMUNITY)
Admission: RE | Admit: 2022-02-23 | Discharge: 2022-02-23 | Disposition: A | Discharge status: Home / Self Care | Payer: PPO | Source: Ambulatory Visit | Attending: Internal Medicine | Admitting: Internal Medicine

## 2022-02-23 ENCOUNTER — Encounter (HOSPITAL_COMMUNITY)
Admission: RE | Disposition: A | Discharge status: Home / Self Care | Payer: Self-pay | Source: Ambulatory Visit | Attending: Internal Medicine

## 2022-02-23 ENCOUNTER — Ambulatory Visit (HOSPITAL_BASED_OUTPATIENT_CLINIC_OR_DEPARTMENT_OTHER): Payer: PPO | Admitting: Anesthesiology

## 2022-02-23 ENCOUNTER — Encounter (HOSPITAL_COMMUNITY): Payer: Self-pay

## 2022-02-23 DIAGNOSIS — Z1211 Encounter for screening for malignant neoplasm of colon: Secondary | ICD-10-CM | POA: Diagnosis not present

## 2022-02-23 DIAGNOSIS — Z950 Presence of cardiac pacemaker: Secondary | ICD-10-CM | POA: Insufficient documentation

## 2022-02-23 DIAGNOSIS — E785 Hyperlipidemia, unspecified: Secondary | ICD-10-CM | POA: Diagnosis not present

## 2022-02-23 DIAGNOSIS — K5730 Diverticulosis of large intestine without perforation or abscess without bleeding: Secondary | ICD-10-CM

## 2022-02-23 DIAGNOSIS — Z8249 Family history of ischemic heart disease and other diseases of the circulatory system: Secondary | ICD-10-CM | POA: Diagnosis not present

## 2022-02-23 DIAGNOSIS — K648 Other hemorrhoids: Secondary | ICD-10-CM | POA: Insufficient documentation

## 2022-02-23 DIAGNOSIS — I10 Essential (primary) hypertension: Secondary | ICD-10-CM | POA: Diagnosis not present

## 2022-02-23 DIAGNOSIS — Z86010 Personal history of colonic polyps: Secondary | ICD-10-CM | POA: Insufficient documentation

## 2022-02-23 DIAGNOSIS — I4891 Unspecified atrial fibrillation: Secondary | ICD-10-CM | POA: Insufficient documentation

## 2022-02-23 DIAGNOSIS — I480 Paroxysmal atrial fibrillation: Secondary | ICD-10-CM

## 2022-02-23 DIAGNOSIS — D123 Benign neoplasm of transverse colon: Secondary | ICD-10-CM | POA: Insufficient documentation

## 2022-02-23 DIAGNOSIS — Z09 Encounter for follow-up examination after completed treatment for conditions other than malignant neoplasm: Secondary | ICD-10-CM

## 2022-02-23 DIAGNOSIS — R001 Bradycardia, unspecified: Secondary | ICD-10-CM

## 2022-02-23 DIAGNOSIS — R42 Dizziness and giddiness: Secondary | ICD-10-CM

## 2022-02-23 DIAGNOSIS — M23329 Other meniscus derangements, posterior horn of medial meniscus, unspecified knee: Secondary | ICD-10-CM

## 2022-02-23 DIAGNOSIS — D125 Benign neoplasm of sigmoid colon: Secondary | ICD-10-CM | POA: Insufficient documentation

## 2022-02-23 DIAGNOSIS — Z87891 Personal history of nicotine dependence: Secondary | ICD-10-CM | POA: Diagnosis not present

## 2022-02-23 DIAGNOSIS — K635 Polyp of colon: Secondary | ICD-10-CM | POA: Insufficient documentation

## 2022-02-23 DIAGNOSIS — E875 Hyperkalemia: Secondary | ICD-10-CM

## 2022-02-23 DIAGNOSIS — K625 Hemorrhage of anus and rectum: Secondary | ICD-10-CM

## 2022-02-23 DIAGNOSIS — J449 Chronic obstructive pulmonary disease, unspecified: Secondary | ICD-10-CM | POA: Diagnosis not present

## 2022-02-23 DIAGNOSIS — D6869 Other thrombophilia: Secondary | ICD-10-CM

## 2022-02-23 HISTORY — PX: COLONOSCOPY WITH PROPOFOL: SHX5780

## 2022-02-23 HISTORY — PX: POLYPECTOMY: SHX5525

## 2022-02-23 LAB — GLUCOSE, CAPILLARY: Glucose-Capillary: 93 mg/dL (ref 70–99)

## 2022-02-23 SURGERY — COLONOSCOPY WITH PROPOFOL
Anesthesia: General

## 2022-02-23 MED ORDER — PROPOFOL 10 MG/ML IV BOLUS
INTRAVENOUS | Status: DC | PRN
  Administered 2022-02-23: 100 mg via INTRAVENOUS

## 2022-02-23 MED ORDER — LIDOCAINE 2% (20 MG/ML) 5 ML SYRINGE
INTRAMUSCULAR | Status: DC | PRN
  Administered 2022-02-23: 50 mg via INTRAVENOUS

## 2022-02-23 MED ORDER — PROPOFOL 500 MG/50ML IV EMUL
INTRAVENOUS | Status: DC | PRN
  Administered 2022-02-23: 200 ug/kg/min via INTRAVENOUS

## 2022-02-23 MED ORDER — LACTATED RINGERS IV SOLN: INTRAVENOUS | Status: DC

## 2022-02-23 NOTE — Interval H&P Note (Signed)
History and Physical Interval Note:  02/23/2022 10:41 AM  Ruben Lee  has presented today for surgery, with the diagnosis of hx colon polyps.  The various methods of treatment have been discussed with the patient and family. After consideration of risks, benefits and other options for treatment, the patient has consented to  Procedure(s) with comments: COLONOSCOPY WITH PROPOFOL (N/A) - 12:30pm as a surgical intervention.  The patient's history has been reviewed, patient examined, no change in status, stable for surgery.  I have reviewed the patient's chart and labs.  Questions were answered to the patient's satisfaction.     Eloise Harman

## 2022-02-23 NOTE — Discharge Instructions (Addendum)
  Colonoscopy Discharge Instructions  Read the instructions outlined below and refer to this sheet in the next few weeks. These discharge instructions provide you with general information on caring for yourself after you leave the hospital. Your doctor may also give you specific instructions. While your treatment has been planned according to the most current medical practices available, unavoidable complications occasionally occur.   ACTIVITY You may resume your regular activity, but move at a slower pace for the next 24 hours.  Take frequent rest periods for the next 24 hours.  Walking will help get rid of the air and reduce the bloated feeling in your belly (abdomen).  No driving for 24 hours (because of the medicine (anesthesia) used during the test).   Do not sign any important legal documents or operate any machinery for 24 hours (because of the anesthesia used during the test).  NUTRITION Drink plenty of fluids.  You may resume your normal diet as instructed by your doctor.  Begin with a light meal and progress to your normal diet. Heavy or fried foods are harder to digest and may make you feel sick to your stomach (nauseated).  Avoid alcoholic beverages for 24 hours or as instructed.  MEDICATIONS You may resume your normal medications unless your doctor tells you otherwise.  WHAT YOU CAN EXPECT TODAY Some feelings of bloating in the abdomen.  Passage of more gas than usual.  Spotting of blood in your stool or on the toilet paper.  IF YOU HAD POLYPS REMOVED DURING THE COLONOSCOPY: No aspirin products for 7 days or as instructed.  No alcohol for 7 days or as instructed.  Eat a soft diet for the next 24 hours.  FINDING OUT THE RESULTS OF YOUR TEST Not all test results are available during your visit. If your test results are not back during the visit, make an appointment with your caregiver to find out the results. Do not assume everything is normal if you have not heard from your  caregiver or the medical facility. It is important for you to follow up on all of your test results.  SEEK IMMEDIATE MEDICAL ATTENTION IF: You have more than a spotting of blood in your stool.  Your belly is swollen (abdominal distention).  You are nauseated or vomiting.  You have a temperature over 101.  You have abdominal pain or discomfort that is severe or gets worse throughout the day.   Your colonoscopy revealed 4 polyp(s) which I removed successfully. Await pathology results, my office will contact you. I recommend repeating colonoscopy in 5 years for surveillance purposes.   You also have diverticulosis and internal hemorrhoids. I would recommend increasing fiber in your diet or adding OTC Benefiber/Metamucil. Be sure to drink at least 4 to 6 glasses of water daily. Follow-up with GI as needed.  Resume Apixaban tomorrow.   I hope you have a great rest of your week!  Elon Alas. Abbey Chatters, D.O. [facility]

## 2022-02-23 NOTE — Op Note (Signed)
[facility] Patient Name: Ruben Lee Procedure Date: 02/23/2022 10:22 AM MRN: [id number] Date of Birth: Mar 24, 1951 Attending MD: Elon Alas. Abbey Chatters DO CSN: [id number] Age: 71 Admit Type: Inpatient Procedure:                Colonoscopy Indications:              Surveillance: Personal history of adenomatous                            polyps on last colonoscopy 3 years ago Providers:                Elon Alas. Abbey Chatters, DO, Janeece Riggers, RN, Crystal                            Page, Randa Spike, Technician, Everardo Pacific Referring MD:              Medicines:                See the Anesthesia note for documentation of the                            administered medications Complications:            No immediate complications. Estimated Blood Loss:     Estimated blood loss was minimal. Procedure:                Pre-Anesthesia Assessment:                           - The anesthesia plan was to use monitored                            anesthesia care (MAC).                           After obtaining informed consent, the colonoscope                            was passed under direct vision. Throughout the                            procedure, the patient's blood pressure, pulse, and                            oxygen saturations were monitored continuously. The                            [id number]-HQ190L ([id number]) scope was introduced through                            the anus and advanced to the the cecum, identified                            by appendiceal orifice and ileocecal valve. The                            colonoscopy  was performed without difficulty. The                            patient tolerated the procedure well. The quality                            of the bowel preparation was evaluated using the                            BBPS [facility] Bowel Preparation Scale) with scores                            of: Right Colon = 3, Transverse Colon = 3 and Left                             Colon = 3 (entire mucosa seen well with no residual                            staining, small fragments of stool or opaque                            liquid). The total BBPS score equals 9. Scope In: 11:08:29 AM Scope Out: 11:26:10 AM Scope Withdrawal Time: 0 hours 15 minutes 11 seconds  Total Procedure Duration: 0 hours 17 minutes 41 seconds  Findings:      The perianal and digital rectal examinations were normal.      Non-bleeding internal hemorrhoids were found during endoscopy.      Multiple medium-mouthed diverticula were found in the sigmoid colon.      Three sessile polyps were found in the transverse colon. The polyps were       3 to 5 mm in size. These polyps were removed with a cold snare.       Resection and retrieval were complete.      A 6 mm polyp was found in the sigmoid colon. The polyp was sessile. The       polyp was removed with a cold snare. Resection and retrieval were       complete.      The exam was otherwise without abnormality on direct and retroflexion       views. Impression:               - Non-bleeding internal hemorrhoids.                           - Diverticulosis in the sigmoid colon.                           - Three 3 to 5 mm polyps in the transverse colon,                            removed with a cold snare. Resected and retrieved.                           - One 6 mm polyp in the sigmoid colon, removed with  a cold snare. Resected and retrieved.                           - The examination was otherwise normal. Moderate Sedation:      Per Anesthesia Care Recommendation:           - Patient has a contact number available for                            emergencies. The signs and symptoms of potential                            delayed complications were discussed with the                            patient. Return to normal activities tomorrow.                            Written discharge instructions were provided  to the                            patient.                           - Resume previous diet.                           - Continue present medications.                           - Await pathology results.                           - Repeat colonoscopy in 5 years for surveillance.                           - Return to GI clinic PRN. Procedure Code(s):        --- Professional ---                           [phone], Colonoscopy, flexible; with removal of                            tumor(s), polyp(s), or other lesion(s) by snare                            technique Diagnosis Code(s):        --- Professional ---                           K63.5, Polyp of colon                           Z86.010, Personal history of colonic polyps                           K64.8, Other hemorrhoids  K57.30, Diverticulosis of large intestine without                            perforation or abscess without bleeding CPT copyright 2019 American Medical Association. All rights reserved. The codes documented in this report are preliminary and upon coder review may  be revised to meet current compliance requirements. Elon Alas. Abbey Chatters, DO [place] Abbey Chatters, DO 02/23/2022 11:28:13 AM This report has been signed electronically. Number of Addenda: 0

## 2022-02-23 NOTE — Transfer of Care (Signed)
Immediate Anesthesia Transfer of Care Note  Patient: Ruben Lee  Procedure(s) Performed: COLONOSCOPY WITH PROPOFOL POLYPECTOMY  Patient Location: Short Stay  Anesthesia Type:MAC  Level of Consciousness: drowsy  Airway & Oxygen Therapy: Patient Spontanous Breathing  Post-op Assessment: Report given to RN and Post -op Vital signs reviewed and stable  Post vital signs: Reviewed and stable  Last Vitals:  Vitals Value Taken Time  BP    Temp    Pulse    Resp    SpO2      Last Pain:  Vitals:   02/23/22 1051  TempSrc:   PainSc: 0-No pain      Patients Stated Pain Goal: 7 (68/34/19 6222)  Complications: No notable events documented.

## 2022-02-23 NOTE — Anesthesia Postprocedure Evaluation (Signed)
Anesthesia Post Note  Patient: Ruben Lee  Procedure(s) Performed: COLONOSCOPY WITH PROPOFOL POLYPECTOMY  Patient location during evaluation: Phase II Anesthesia Type: General Level of consciousness: awake and alert Pain management: pain level controlled Vital Signs Assessment: post-procedure vital signs reviewed and stable Respiratory status: spontaneous breathing, nonlabored ventilation, respiratory function stable and patient connected to nasal cannula oxygen Cardiovascular status: blood pressure returned to baseline and stable Postop Assessment: no apparent nausea or vomiting Anesthetic complications: no   There were no known notable events for this encounter.   Last Vitals:  Vitals:   02/23/22 1134 02/23/22 1139  BP: 105/68 118/76  Pulse:    Resp:    Temp:    SpO2:      Last Pain:  Vitals:   02/23/22 1130  TempSrc: Axillary  PainSc: 0-No pain                 Trixie Rude

## 2022-02-23 NOTE — Anesthesia Preprocedure Evaluation (Signed)
Anesthesia Evaluation  Patient identified by MRN, date of birth, ID band Patient awake    Reviewed: Allergy & Precautions, NPO status , Patient's Chart, lab work & pertinent test results  Airway Mallampati: II  TM Distance: >3 FB Neck ROM: Full    Dental  (+) Dental Advisory Given   Pulmonary COPD, former smoker,    Pulmonary exam normal breath sounds clear to auscultation       Cardiovascular Exercise Tolerance: Good hypertension, Pt. on medications Normal cardiovascular exam+ dysrhythmias Atrial Fibrillation + pacemaker (SSS)  Rhythm:Regular Rate:Normal     Neuro/Psych negative neurological ROS  negative psych ROS   GI/Hepatic negative GI ROS, Neg liver ROS,   Endo/Other  negative endocrine ROS  Renal/GU negative Renal ROS  negative genitourinary   Musculoskeletal negative musculoskeletal ROS (+)   Abdominal   Peds negative pediatric ROS (+)  Hematology negative hematology ROS (+)   Anesthesia Other Findings   Reproductive/Obstetrics negative OB ROS                            Anesthesia Physical Anesthesia Plan  ASA: 3  Anesthesia Plan: General   Post-op Pain Management: Minimal or no pain anticipated   Induction:   PONV Risk Score and Plan: Propofol infusion  Airway Management Planned: Nasal Cannula and Natural Airway  Additional Equipment:   Intra-op Plan:   Post-operative Plan:   Informed Consent: I have reviewed the patients History and Physical, chart, labs and discussed the procedure including the risks, benefits and alternatives for the proposed anesthesia with the patient or authorized representative who has indicated his/her understanding and acceptance.     Dental advisory given  Plan Discussed with: CRNA and Surgeon  Anesthesia Plan Comments:         Anesthesia Quick Evaluation

## 2022-02-25 LAB — SURGICAL PATHOLOGY

## 2022-02-27 ENCOUNTER — Encounter (HOSPITAL_COMMUNITY): Payer: Self-pay | Admitting: Internal Medicine

## 2022-03-18 ENCOUNTER — Ambulatory Visit (INDEPENDENT_AMBULATORY_CARE_PROVIDER_SITE_OTHER): Payer: PPO

## 2022-03-18 DIAGNOSIS — I495 Sick sinus syndrome: Secondary | ICD-10-CM | POA: Diagnosis not present

## 2022-03-18 LAB — CUP PACEART REMOTE DEVICE CHECK
Battery Remaining Longevity: 100 mo
Battery Remaining Percentage: 86 %
Battery Voltage: 3.01 V
Brady Statistic AP VP Percent: 1.1 %
Brady Statistic AP VS Percent: 41 %
Brady Statistic AS VP Percent: 1 %
Brady Statistic AS VS Percent: 57 %
Brady Statistic RA Percent Paced: 41 %
Brady Statistic RV Percent Paced: 1.3 %
Date Time Interrogation Session: 20230802191731
Implantable Lead Implant Date: 20210803
Implantable Lead Implant Date: 20210803
Implantable Lead Location: 753859
Implantable Lead Location: 753860
Implantable Pulse Generator Implant Date: 20210803
Lead Channel Impedance Value: 460 Ohm
Lead Channel Impedance Value: 460 Ohm
Lead Channel Pacing Threshold Amplitude: 0.5 V
Lead Channel Pacing Threshold Amplitude: 0.75 V
Lead Channel Pacing Threshold Pulse Width: 0.5 ms
Lead Channel Pacing Threshold Pulse Width: 0.5 ms
Lead Channel Sensing Intrinsic Amplitude: 12 mV
Lead Channel Sensing Intrinsic Amplitude: 5 mV
Lead Channel Setting Pacing Amplitude: 2 V
Lead Channel Setting Pacing Amplitude: 2.5 V
Lead Channel Setting Pacing Pulse Width: 0.5 ms
Lead Channel Setting Sensing Sensitivity: 2 mV
Pulse Gen Model: 2272
Pulse Gen Serial Number: 3851870

## 2022-04-10 NOTE — Progress Notes (Signed)
Remote pacemaker transmission.   

## 2022-04-23 ENCOUNTER — Other Ambulatory Visit (HOSPITAL_COMMUNITY): Payer: Self-pay | Admitting: Physician Assistant

## 2022-05-14 DIAGNOSIS — E1140 Type 2 diabetes mellitus with diabetic neuropathy, unspecified: Secondary | ICD-10-CM | POA: Diagnosis not present

## 2022-05-14 DIAGNOSIS — E782 Mixed hyperlipidemia: Secondary | ICD-10-CM | POA: Diagnosis not present

## 2022-05-16 DIAGNOSIS — I480 Paroxysmal atrial fibrillation: Secondary | ICD-10-CM | POA: Diagnosis not present

## 2022-05-16 DIAGNOSIS — E782 Mixed hyperlipidemia: Secondary | ICD-10-CM | POA: Diagnosis not present

## 2022-05-16 DIAGNOSIS — I10 Essential (primary) hypertension: Secondary | ICD-10-CM | POA: Diagnosis not present

## 2022-05-16 DIAGNOSIS — E1140 Type 2 diabetes mellitus with diabetic neuropathy, unspecified: Secondary | ICD-10-CM | POA: Diagnosis not present

## 2022-05-21 DIAGNOSIS — E041 Nontoxic single thyroid nodule: Secondary | ICD-10-CM | POA: Diagnosis not present

## 2022-05-21 DIAGNOSIS — Z87448 Personal history of other diseases of urinary system: Secondary | ICD-10-CM | POA: Diagnosis not present

## 2022-05-21 DIAGNOSIS — R945 Abnormal results of liver function studies: Secondary | ICD-10-CM | POA: Diagnosis not present

## 2022-05-21 DIAGNOSIS — M179 Osteoarthritis of knee, unspecified: Secondary | ICD-10-CM | POA: Diagnosis not present

## 2022-05-21 DIAGNOSIS — E782 Mixed hyperlipidemia: Secondary | ICD-10-CM | POA: Diagnosis not present

## 2022-05-21 DIAGNOSIS — M5030 Other cervical disc degeneration, unspecified cervical region: Secondary | ICD-10-CM | POA: Diagnosis not present

## 2022-05-21 DIAGNOSIS — I480 Paroxysmal atrial fibrillation: Secondary | ICD-10-CM | POA: Diagnosis not present

## 2022-05-21 DIAGNOSIS — I10 Essential (primary) hypertension: Secondary | ICD-10-CM | POA: Diagnosis not present

## 2022-05-21 DIAGNOSIS — E875 Hyperkalemia: Secondary | ICD-10-CM | POA: Diagnosis not present

## 2022-05-21 DIAGNOSIS — I495 Sick sinus syndrome: Secondary | ICD-10-CM | POA: Diagnosis not present

## 2022-05-21 DIAGNOSIS — Z0000 Encounter for general adult medical examination without abnormal findings: Secondary | ICD-10-CM | POA: Diagnosis not present

## 2022-05-21 DIAGNOSIS — E1140 Type 2 diabetes mellitus with diabetic neuropathy, unspecified: Secondary | ICD-10-CM | POA: Diagnosis not present

## 2022-06-17 ENCOUNTER — Ambulatory Visit (INDEPENDENT_AMBULATORY_CARE_PROVIDER_SITE_OTHER): Payer: PPO

## 2022-06-17 DIAGNOSIS — I495 Sick sinus syndrome: Secondary | ICD-10-CM

## 2022-06-18 LAB — CUP PACEART REMOTE DEVICE CHECK
Battery Remaining Longevity: 97 mo
Battery Remaining Percentage: 84 %
Battery Voltage: 3.01 V
Brady Statistic AP VP Percent: 1.2 %
Brady Statistic AP VS Percent: 44 %
Brady Statistic AS VP Percent: 1 %
Brady Statistic AS VS Percent: 55 %
Brady Statistic RA Percent Paced: 43 %
Brady Statistic RV Percent Paced: 1.3 %
Date Time Interrogation Session: 20231101181009
Implantable Lead Connection Status: 753985
Implantable Lead Connection Status: 753985
Implantable Lead Implant Date: 20210803
Implantable Lead Implant Date: 20210803
Implantable Lead Location: 753859
Implantable Lead Location: 753860
Implantable Pulse Generator Implant Date: 20210803
Lead Channel Impedance Value: 480 Ohm
Lead Channel Impedance Value: 480 Ohm
Lead Channel Pacing Threshold Amplitude: 0.5 V
Lead Channel Pacing Threshold Amplitude: 0.75 V
Lead Channel Pacing Threshold Pulse Width: 0.5 ms
Lead Channel Pacing Threshold Pulse Width: 0.5 ms
Lead Channel Sensing Intrinsic Amplitude: 12 mV
Lead Channel Sensing Intrinsic Amplitude: 5 mV
Lead Channel Setting Pacing Amplitude: 2 V
Lead Channel Setting Pacing Amplitude: 2.5 V
Lead Channel Setting Pacing Pulse Width: 0.5 ms
Lead Channel Setting Sensing Sensitivity: 2 mV
Pulse Gen Model: 2272
Pulse Gen Serial Number: 3851870

## 2022-06-30 NOTE — Progress Notes (Signed)
Remote pacemaker transmission.   

## 2022-07-02 DIAGNOSIS — E119 Type 2 diabetes mellitus without complications: Secondary | ICD-10-CM | POA: Diagnosis not present

## 2022-07-03 ENCOUNTER — Ambulatory Visit: Payer: PPO | Admitting: Physician Assistant

## 2022-07-20 ENCOUNTER — Other Ambulatory Visit (HOSPITAL_COMMUNITY): Payer: Self-pay | Admitting: Physician Assistant

## 2022-07-20 DIAGNOSIS — I480 Paroxysmal atrial fibrillation: Secondary | ICD-10-CM

## 2022-07-20 NOTE — Telephone Encounter (Addendum)
Prescription refill request for Eliquis received. Indication: Afib  Last office visit: 01/28/22 Marlene Lard)  Scr: 0.85 (02/19/22)  Age: 71 Weight: 98.7kg  Appropriate dose and refill sent to requested pharmacy.

## 2022-07-22 NOTE — Progress Notes (Signed)
Electrophysiology Office Note Date: 07/24/2022  ID:  Ruben Lee, DOB Nov 06, 1950, MRN [id number]  PCP: Celene Squibb, MD Primary Cardiologist: None Electrophysiologist: Dr. Rayann Heman -> Melida Quitter, MD   CC: Pacemaker follow-up  Ruben Lee is a 71 y.o. male seen today for Melida Quitter, MD for routine electrophysiology followup. Since last being seen in our clinic the patient reports doing very well.  he denies chest pain, palpitations, dyspnea, PND, orthopnea, nausea, vomiting, dizziness, syncope, edema, weight gain, or early satiety.   Device History: StMudlogger PPM implanted 03/2020 for SSS/Tachy-brady  Past Medical History:  Diagnosis Date   Atrial fibrillation (HCC)    COPD (chronic obstructive pulmonary disease) (HCC)    Dysrhythmia    Hyperlipidemia    Hypertension    Pre-diabetes    Presence of permanent cardiac pacemaker    Sick sinus syndrome ([place])    a. s/p St. Jude PPM placement in 03/2020   Past Surgical History:  Procedure Laterality Date   COLONOSCOPY N/A 09/16/2015   Procedure: COLONOSCOPY;  Surgeon: Danie Binder, MD;  [facility]: [facility];  Service: Endoscopy;  Laterality: N/A;  1230pm   COLONOSCOPY N/A 02/10/2019   Procedure: COLONOSCOPY;  Surgeon: Danie Binder, MD;  Location: [facility];  Service: Endoscopy;  Laterality: N/A;  2:00   COLONOSCOPY WITH PROPOFOL N/A 02/23/2022   Procedure: COLONOSCOPY WITH PROPOFOL;  Surgeon: Eloise Harman, DO;  Location: [facility];  Service: Endoscopy;  Laterality: N/A;  12:30pm   KNEE ARTHROSCOPY WITH MEDIAL MENISECTOMY Right 03/11/2016   Procedure: KNEE ARTHROSCOPY WITH MEDIAL MENISECTOMY;  Surgeon: Carole Civil, MD;  Location: [facility];  Service: Orthopedics;  Laterality: Right;   None to date     As of 08/29/15   PACEMAKER IMPLANT N/A 03/19/2020   Procedure: PACEMAKER IMPLANT;  Surgeon: Thompson Grayer, MD;  Location: [facility];  Service: Cardiovascular;  Laterality:  N/A;   POLYPECTOMY  02/10/2019   Procedure: POLYPECTOMY;  Surgeon: Danie Binder, MD;  Location: [facility];  Service: Endoscopy;;  colon   POLYPECTOMY  02/23/2022   Procedure: POLYPECTOMY;  Surgeon: Eloise Harman, DO;  Location: [facility];  Service: Endoscopy;;    Current Outpatient Medications  Medication Sig Dispense Refill   apixaban (ELIQUIS) 5 MG TABS tablet TAKE ONE TABLET BY MOUTH TWICE DAILY 60 tablet 5   cetirizine (ZYRTEC) 10 MG tablet Take 10 mg by mouth daily.      diclofenac Sodium (VOLTAREN) 1 % GEL Apply topically as needed (neck pain).     HYDROcodone-acetaminophen (NORCO) 7.5-325 MG tablet Take 1 tablet by mouth every 6 (six) hours as needed for moderate pain.     lovastatin (MEVACOR) 20 MG tablet Take 20 mg by mouth at bedtime.     Melatonin 10 MG TABS Take 10 mg by mouth at bedtime.     metFORMIN (GLUCOPHAGE) 500 MG tablet Take 500 mg by mouth daily.     olmesartan (BENICAR) 20 MG tablet Take 20 mg by mouth daily.     No current facility-administered medications for this visit.    Allergies:   Patient has no known allergies.   Social History: Social History   Socioeconomic History   Marital status: Widowed    Spouse name: Not on file   Number of children: Not on file   Years of education: Not on file   Highest education level: Not on file  Occupational  History   Not on file  Tobacco Use   Smoking status: Former    Packs/day: 1.00    Years: 43.00    Total pack years: 43.00    Types: Cigarettes    Quit date: 07/18/2010    Years since quitting: 12.0   Smokeless tobacco: Never   Tobacco comments:    Former smoker 12/18/21  Vaping Use   Vaping Use: Never used  Substance and Sexual Activity   Alcohol use: No    Alcohol/week: 0.0 standard drinks of alcohol    Comment: None in 20 years   Drug use: No   Sexual activity: Not on file  Other Topics Concern   Not on file  Social History Narrative   Not on file   Social Determinants of Health    Financial Resource Strain: Not on file  Food Insecurity: Not on file  Transportation Needs: Not on file  Physical Activity: Not on file  Stress: Not on file  Social Connections: Not on file  Intimate Partner Violence: Not on file    Family History: Family History  Problem Relation Age of Onset   Colon cancer Cousin        "maybe a distant cousin"   COPD Mother    Heart attack Father      Review of Systems: All other systems reviewed and are otherwise negative except as noted above.  Physical Exam: Vitals:   07/24/22 0754  BP: 136/80  Pulse: 77  SpO2: 96%  Weight: 218 lb (98.9 kg)  Height: '6\' 3"'$  (1.905 m)     GEN- The patient is well appearing, alert and oriented x 3 today.   HEENT: normocephalic, atraumatic; sclera clear, conjunctiva pink; hearing intact; oropharynx clear; neck supple, no JVP Lymph- no cervical lymphadenopathy Lungs- Clear to ausculation bilaterally, normal work of breathing.  No wheezes, rales, rhonchi Heart- Regular  rate and rhythm, no murmurs, rubs or gallops, PMI not laterally displaced GI- soft, non-tender, non-distended, bowel sounds present, no hepatosplenomegaly Extremities- no clubbing or cyanosis. No peripheral edema; DP/PT/radial pulses 2+ bilaterally MS- no significant deformity or atrophy Skin- warm and dry, no rash or lesion; PPM pocket well healed Psych- euthymic mood, full affect Neuro- strength and sensation are intact  PPM Interrogation-  reviewed in detail today,  See PACEART report.  EKG:  EKG is not ordered today.  Recent Labs: 02/19/2022: BUN 17; Creatinine, Ser 0.85; Potassium 4.0; Sodium 140   Wt Readings from Last 3 Encounters:  07/24/22 218 lb (98.9 kg)  02/19/22 217 lb 9.5 oz (98.7 kg)  01/28/22 217 lb 9.6 oz (98.7 kg)     Other studies Reviewed: Additional studies/ records that were reviewed today include: Previous EP office notes, Previous remote checks, Most recent labwork.   Assessment and Plan:  1.  Tachy-Brady syndrome s/p St. Jude PPM  Normal PPM function See Pace Art report No changes today  2. PAF Continue eliquis 5 mg BID, CHA2DS2VASc is at least 2. Burden <1%  3. HTN Stable on current regimen   Current medicines are reviewed at length with the patient today.     Disposition:   Follow up with Dr. Myles Gip in 12 months    Signed, Shirley Friar, PA-C  07/24/2022 7:59 AM  [place] [phone] (office) [phone] (fax)

## 2022-07-24 ENCOUNTER — Encounter: Payer: Self-pay | Admitting: Student

## 2022-07-24 ENCOUNTER — Ambulatory Visit: Payer: PPO | Attending: Physician Assistant | Admitting: Student

## 2022-07-24 VITALS — BP 136/80 | HR 77 | Ht 75.0 in | Wt 218.0 lb

## 2022-07-24 DIAGNOSIS — I495 Sick sinus syndrome: Secondary | ICD-10-CM | POA: Diagnosis not present

## 2022-07-24 DIAGNOSIS — I10 Essential (primary) hypertension: Secondary | ICD-10-CM

## 2022-07-24 DIAGNOSIS — I480 Paroxysmal atrial fibrillation: Secondary | ICD-10-CM | POA: Diagnosis not present

## 2022-07-24 LAB — CUP PACEART INCLINIC DEVICE CHECK
Battery Remaining Longevity: 105 mo
Battery Voltage: 3.01 V
Brady Statistic RA Percent Paced: 44 %
Brady Statistic RV Percent Paced: 1.2 %
Date Time Interrogation Session: 20231208081105
Implantable Lead Connection Status: 753985
Implantable Lead Connection Status: 753985
Implantable Lead Implant Date: 20210803
Implantable Lead Implant Date: 20210803
Implantable Lead Location: 753859
Implantable Lead Location: 753860
Implantable Pulse Generator Implant Date: 20210803
Lead Channel Impedance Value: 475 Ohm
Lead Channel Impedance Value: 475 Ohm
Lead Channel Pacing Threshold Amplitude: 0.5 V
Lead Channel Pacing Threshold Amplitude: 0.75 V
Lead Channel Pacing Threshold Amplitude: 0.75 V
Lead Channel Pacing Threshold Pulse Width: 0.5 ms
Lead Channel Pacing Threshold Pulse Width: 0.5 ms
Lead Channel Pacing Threshold Pulse Width: 0.5 ms
Lead Channel Sensing Intrinsic Amplitude: 12 mV
Lead Channel Sensing Intrinsic Amplitude: 5 mV
Lead Channel Setting Pacing Amplitude: 2 V
Lead Channel Setting Pacing Amplitude: 2.5 V
Lead Channel Setting Pacing Pulse Width: 0.5 ms
Lead Channel Setting Sensing Sensitivity: 2 mV
Pulse Gen Model: 2272
Pulse Gen Serial Number: 3851870

## 2022-07-24 NOTE — Patient Instructions (Addendum)
Medication Instructions:  Your physician recommends that you continue on your current medications as directed. Please refer to the Current Medication list given to you today.  *If you need a refill on your cardiac medications before your next appointment, please call your pharmacy*  Lab Work: None ordered.  If you have labs (blood work) drawn today and your tests are completely normal, you will receive your results only by: [place] (if you have MyChart) OR A paper copy in the mail If you have any lab test that is abnormal or we need to change your treatment, we will call you to review the results.  Testing/Procedures: None ordered.  Follow-Up: At [facility], you and your health needs are our priority.  As part of our continuing mission to provide you with exceptional heart care, we have created designated Provider Care Teams.  These Care Teams include your primary Cardiologist (physician) and Advanced Practice Providers (APPs -  Physician Assistants and Nurse Practitioners) who all work together to provide you with the care you need, when you need it.  We recommend signing up for the patient portal called "MyChart".  Sign up information is provided on this After Visit Summary.  MyChart is used to connect with patients for Virtual Visits (Telemedicine).  Patients are able to view lab/test results, encounter notes, upcoming appointments, etc.  Non-urgent messages can be sent to your provider as well.   To learn more about what you can do with MyChart, go to [email or web address].    Your next appointment:   1 year(s) follow up with Dr. Myles Gip  The format for your next appointment:   In Person  Provider:   Legrand Como "Jonni Sanger" Chalmers Cater, PA-C  Remote monitoring is used to monitor your Pacemaker from home. This monitoring reduces the number of office visits required to check your device to one time per year. It allows [place] to keep an eye on the functioning of your device to ensure it  is working properly. You are scheduled for a device check from home on 09/16/22. You may send your transmission at any time that day. If you have a wireless device, the transmission will be sent automatically. After your physician reviews your transmission, you will receive a postcard with your next transmission date.  Important Information About Sugar

## 2022-08-25 IMAGING — MR MR CERVICAL SPINE W/O CM
4 of 5 series · 18 of 48 positions shown · non-contrast
Comparison: Previous MRI from 03/10/2011.

CLINICAL DATA: Initial evaluation for chronic cervicalgia, left
shoulder pain with radiation into arm and hand.

EXAM:
MRI CERVICAL SPINE WITHOUT CONTRAST
TECHNIQUE: Multiplanar, multisequence MR imaging of the cervical spine was
performed. No intravenous contrast was administered.

[Series 2: T2 · sagittal · 3.0mm · 0.43mm/px · 6 of 14 slices shown (1 of 2)]
[im 1/14]
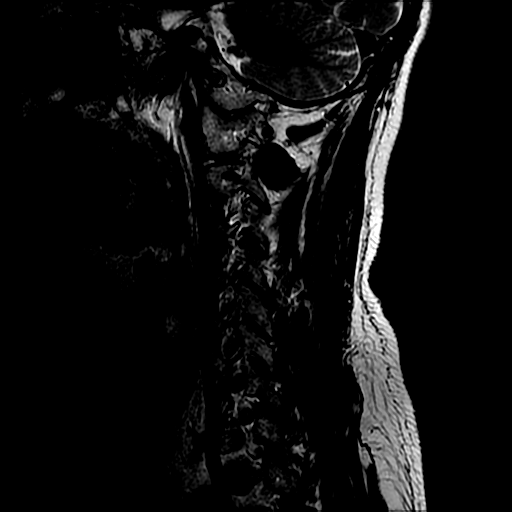
[im 3/14]
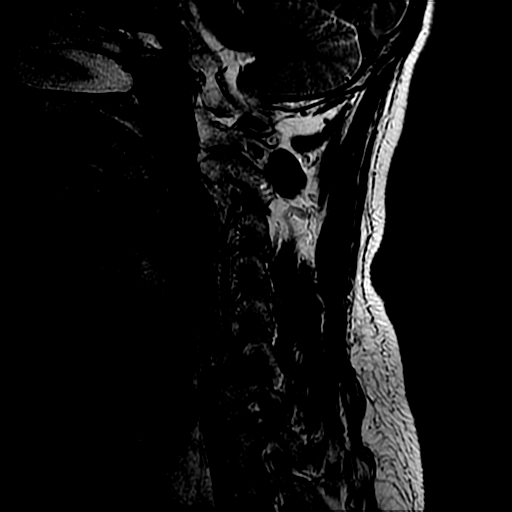
[im 6/14]
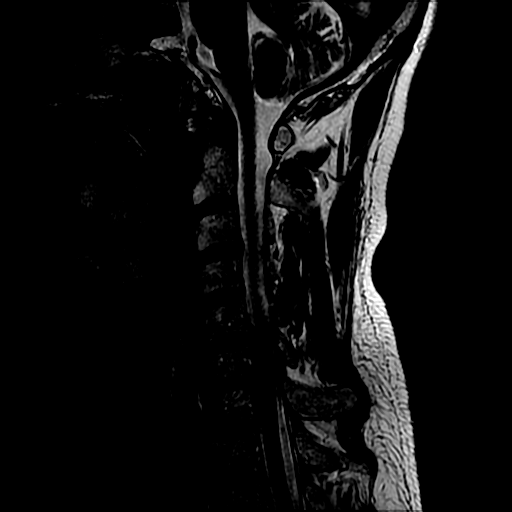
[im 8/14]
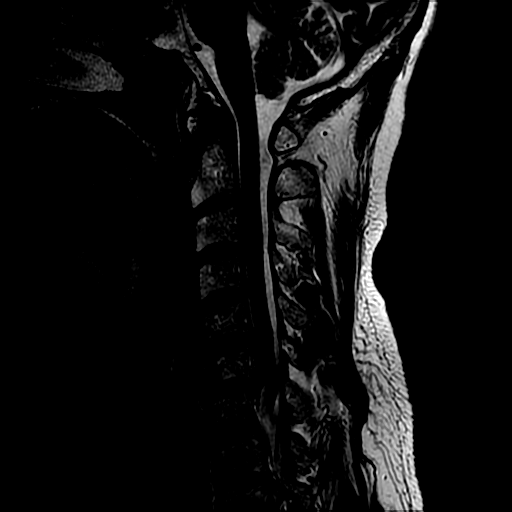
[im 11/14]
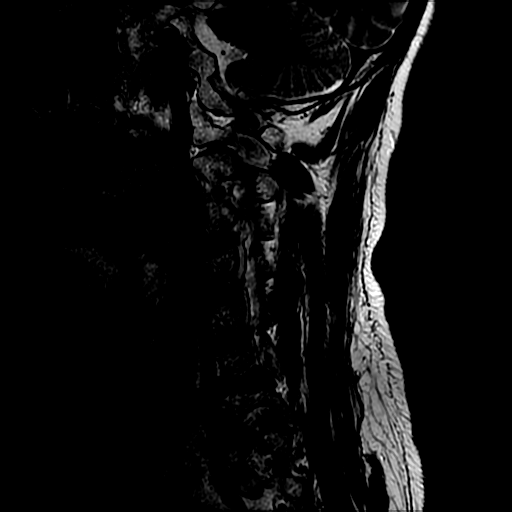
[im 14/14]
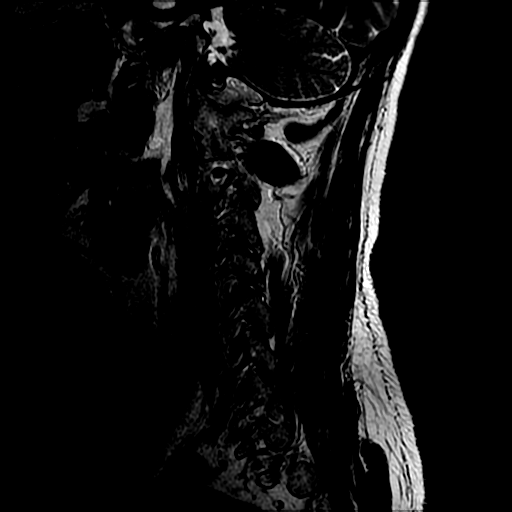

[Series 3: sag ir · sagittal · 3.0mm · 0.43mm/px · 3 of 14 slices shown]
[im 3/14]
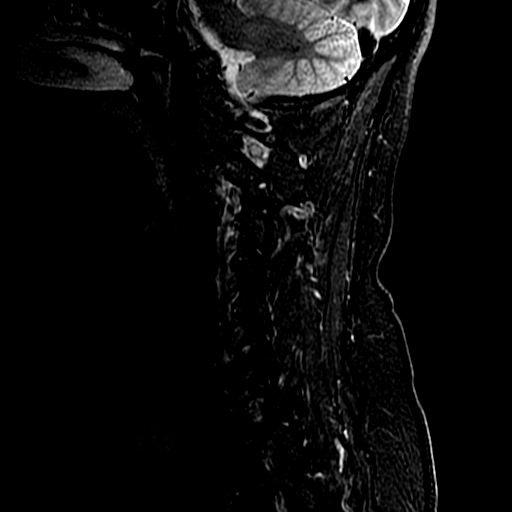
[im 8/14]
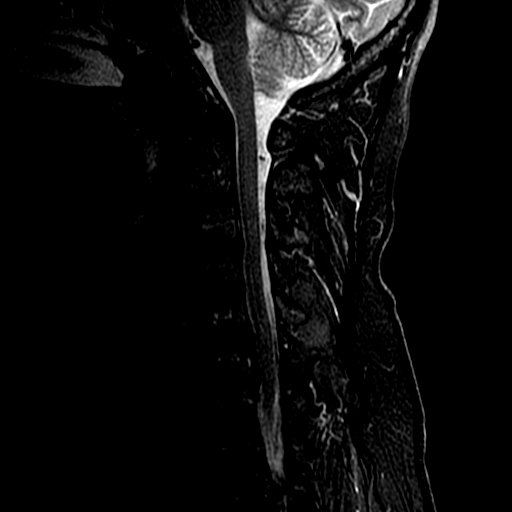
[im 14/14]
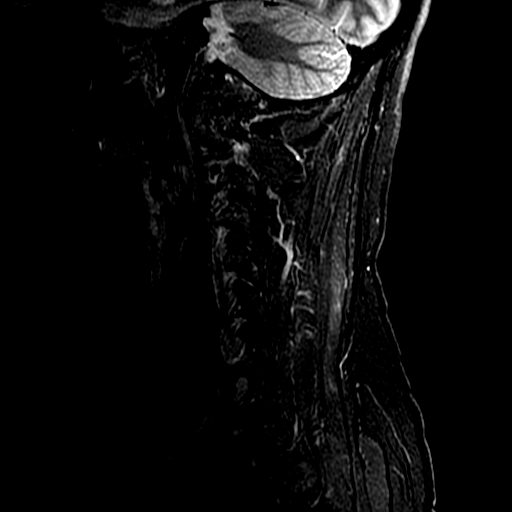

[Series 4: T1 · sagittal · 3.0mm · 0.43mm/px · 3 of 14 slices shown]
[im 3/14]
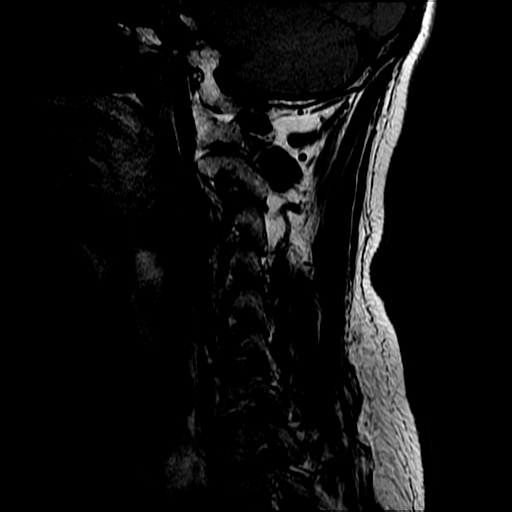
[im 8/14]
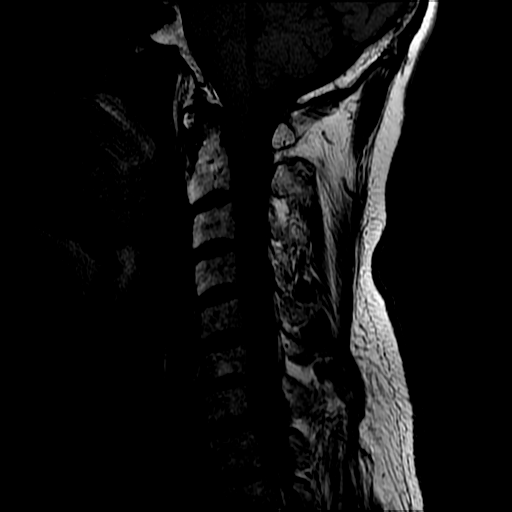
[im 14/14]
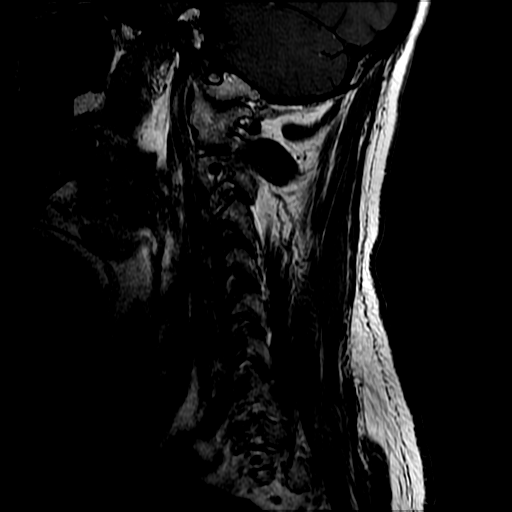

[Series 6: T2 · axial · 3.0mm · 0.39mm/px · z∈[-71,+19]mm · 6 of 34 slices shown (2 of 2)]
[im 1/34]
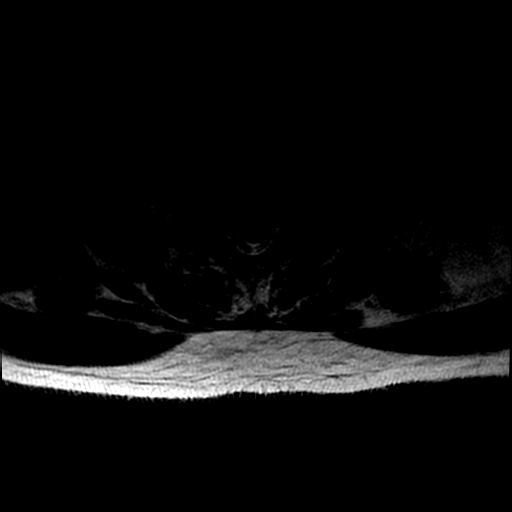
[im 5/34]
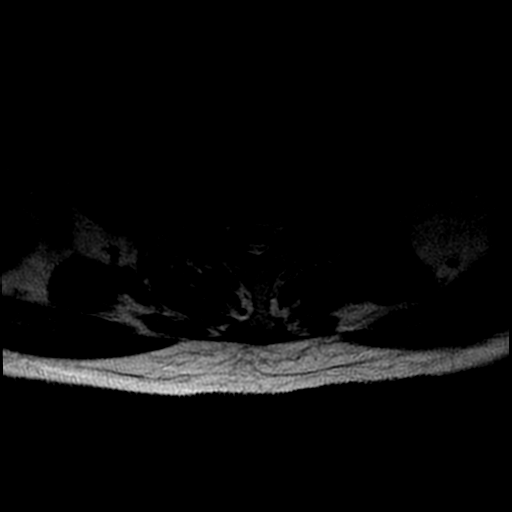
[im 10/34]
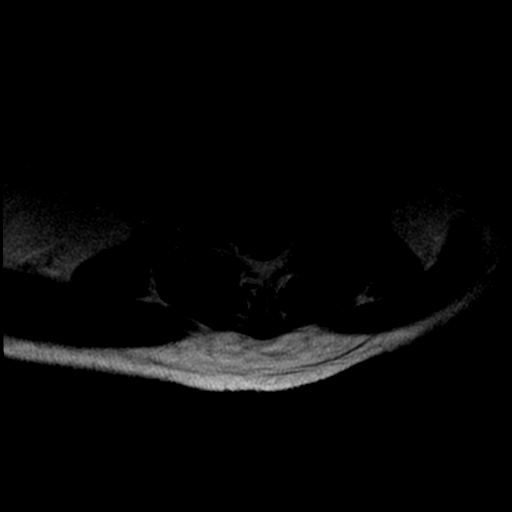
[im 15/34]
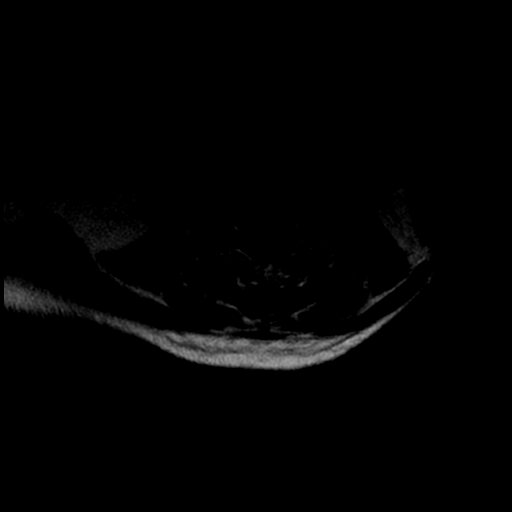
[im 17/34]
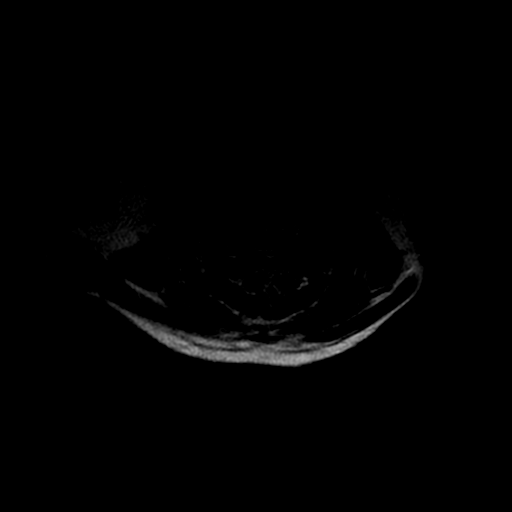
[im 29/34]
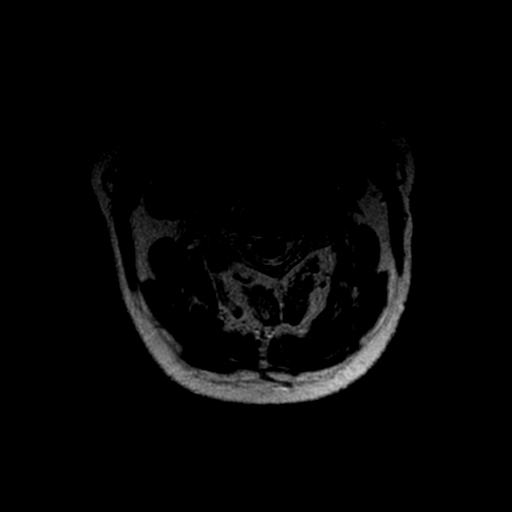

[18 of 48 positions shown; findings below may reference images not displayed]

FINDINGS: Alignment: Straightening of the normal cervical lordosis. No
listhesis.

Vertebrae: Vertebral body height maintained without acute or chronic
fracture. Bone marrow signal intensity within normal limits. No
discrete or worrisome osseous lesions. No abnormal marrow edema.

Cord: Normal signal and morphology.

Posterior Fossa, vertebral arteries, paraspinal tissues: Visualized
brain and posterior fossa within normal limits. Craniocervical
junction normal. Paraspinous and prevertebral soft tissues within
normal limits. Normal intravascular flow voids seen within the
vertebral arteries bilaterally.

Disc levels:

C2-C3: Small central disc protrusion indents the ventral thecal sac,
slightly asymmetric to the left. Minimal flattening of the left
ventral cord without significant spinal stenosis. Mild uncovertebral
hypertrophy without significant foraminal stenosis.

C3-C4: Central disc protrusion indents the ventral thecal sac,
contacting and mildly flattening the ventral cord. Mild spinal
stenosis. Superimposed mild uncovertebral hypertrophy without
significant foraminal encroachment.

C4-C5: Shallow central disc protrusion indents the ventral thecal
sac, contacting and mildly flattening the ventral cord. No more than
mild spinal stenosis. Superimposed mild uncinate hypertrophy without
significant foraminal encroachment.

C5-C6: Lobulated broad-based posterior disc bulge flattens and
largely effaces the ventral thecal sac. Resultant moderate spinal
stenosis with mild cord flattening, but no cord signal changes.
Superimposed right greater than left uncovertebral hypertrophy.
Resultant severe right with moderate left C6 foraminal narrowing.

C6-C7: Degenerative intervertebral disc space narrowing with diffuse
disc osteophyte, asymmetric to the left. Broad-based posterior
component flattens and effaces the ventral thecal sac, greater on
the left. Secondary cord flattening without cord signal changes.
Moderate spinal stenosis. Severe left with moderate right C7
foraminal narrowing.

C7-T1: Negative interspace. Minimal facet hypertrophy. No canal or
foraminal stenosis.

Visualized upper thoracic spine demonstrates no significant finding.
IMPRESSION: 1. Left eccentric disc osteophyte at C6-7 with resultant moderate
spinal stenosis, with severe left and moderate right C7 foraminal
narrowing.
2. Degenerative disc bulge with uncovertebral hypertrophy at C5-6
with resultant moderate canal, with severe right and moderate left
C6 foraminal stenosis.
3. Small central disc protrusions at C2-3 through C4-5 with
resultant mild spinal stenosis.

## 2022-09-02 DIAGNOSIS — E1140 Type 2 diabetes mellitus with diabetic neuropathy, unspecified: Secondary | ICD-10-CM | POA: Diagnosis not present

## 2022-09-02 DIAGNOSIS — E782 Mixed hyperlipidemia: Secondary | ICD-10-CM | POA: Diagnosis not present

## 2022-09-08 DIAGNOSIS — M179 Osteoarthritis of knee, unspecified: Secondary | ICD-10-CM | POA: Diagnosis not present

## 2022-09-08 DIAGNOSIS — E1140 Type 2 diabetes mellitus with diabetic neuropathy, unspecified: Secondary | ICD-10-CM | POA: Diagnosis not present

## 2022-09-08 DIAGNOSIS — G43B0 Ophthalmoplegic migraine, not intractable: Secondary | ICD-10-CM | POA: Diagnosis not present

## 2022-09-08 DIAGNOSIS — I10 Essential (primary) hypertension: Secondary | ICD-10-CM | POA: Diagnosis not present

## 2022-09-08 DIAGNOSIS — I495 Sick sinus syndrome: Secondary | ICD-10-CM | POA: Diagnosis not present

## 2022-09-08 DIAGNOSIS — M5030 Other cervical disc degeneration, unspecified cervical region: Secondary | ICD-10-CM | POA: Diagnosis not present

## 2022-09-08 DIAGNOSIS — E8809 Other disorders of plasma-protein metabolism, not elsewhere classified: Secondary | ICD-10-CM | POA: Diagnosis not present

## 2022-09-08 DIAGNOSIS — E041 Nontoxic single thyroid nodule: Secondary | ICD-10-CM | POA: Diagnosis not present

## 2022-09-08 DIAGNOSIS — I480 Paroxysmal atrial fibrillation: Secondary | ICD-10-CM | POA: Diagnosis not present

## 2022-09-08 DIAGNOSIS — E1142 Type 2 diabetes mellitus with diabetic polyneuropathy: Secondary | ICD-10-CM | POA: Diagnosis not present

## 2022-09-08 DIAGNOSIS — E782 Mixed hyperlipidemia: Secondary | ICD-10-CM | POA: Diagnosis not present

## 2022-09-08 DIAGNOSIS — E875 Hyperkalemia: Secondary | ICD-10-CM | POA: Diagnosis not present

## 2022-09-16 ENCOUNTER — Ambulatory Visit: Payer: PPO

## 2022-09-16 DIAGNOSIS — I495 Sick sinus syndrome: Secondary | ICD-10-CM

## 2022-09-16 LAB — CUP PACEART REMOTE DEVICE CHECK
Battery Remaining Longevity: 95 mo
Battery Remaining Percentage: 82 %
Battery Voltage: 3.01 V
Brady Statistic AP VP Percent: 1 %
Brady Statistic AP VS Percent: 46 %
Brady Statistic AS VP Percent: 1 %
Brady Statistic AS VS Percent: 53 %
Brady Statistic RA Percent Paced: 45 %
Brady Statistic RV Percent Paced: 1 %
Date Time Interrogation Session: 20240131020013
Implantable Lead Connection Status: 753985
Implantable Lead Connection Status: 753985
Implantable Lead Implant Date: 20210803
Implantable Lead Implant Date: 20210803
Implantable Lead Location: 753859
Implantable Lead Location: 753860
Implantable Pulse Generator Implant Date: 20210803
Lead Channel Impedance Value: 460 Ohm
Lead Channel Impedance Value: 510 Ohm
Lead Channel Pacing Threshold Amplitude: 0.5 V
Lead Channel Pacing Threshold Amplitude: 0.75 V
Lead Channel Pacing Threshold Pulse Width: 0.5 ms
Lead Channel Pacing Threshold Pulse Width: 0.5 ms
Lead Channel Sensing Intrinsic Amplitude: 12 mV
Lead Channel Sensing Intrinsic Amplitude: 5 mV
Lead Channel Setting Pacing Amplitude: 2 V
Lead Channel Setting Pacing Amplitude: 2.5 V
Lead Channel Setting Pacing Pulse Width: 0.5 ms
Lead Channel Setting Sensing Sensitivity: 2 mV
Pulse Gen Model: 2272
Pulse Gen Serial Number: 3851870

## 2022-10-09 NOTE — Progress Notes (Signed)
Remote pacemaker transmission.   

## 2022-12-16 ENCOUNTER — Ambulatory Visit (INDEPENDENT_AMBULATORY_CARE_PROVIDER_SITE_OTHER): Payer: PPO

## 2022-12-16 DIAGNOSIS — I495 Sick sinus syndrome: Secondary | ICD-10-CM | POA: Diagnosis not present

## 2022-12-16 LAB — CUP PACEART REMOTE DEVICE CHECK
Battery Remaining Longevity: 92 mo
Battery Remaining Percentage: 79 %
Battery Voltage: 3.01 V
Brady Statistic AP VP Percent: 1 %
Brady Statistic AP VS Percent: 48 %
Brady Statistic AS VP Percent: 1 %
Brady Statistic AS VS Percent: 52 %
Brady Statistic RA Percent Paced: 47 %
Brady Statistic RV Percent Paced: 1 %
Date Time Interrogation Session: 20240501153256
Implantable Lead Connection Status: 753985
Implantable Lead Connection Status: 753985
Implantable Lead Implant Date: 20210803
Implantable Lead Implant Date: 20210803
Implantable Lead Location: 753859
Implantable Lead Location: 753860
Implantable Pulse Generator Implant Date: 20210803
Lead Channel Impedance Value: 480 Ohm
Lead Channel Impedance Value: 480 Ohm
Lead Channel Pacing Threshold Amplitude: 0.5 V
Lead Channel Pacing Threshold Amplitude: 0.75 V
Lead Channel Pacing Threshold Pulse Width: 0.5 ms
Lead Channel Pacing Threshold Pulse Width: 0.5 ms
Lead Channel Sensing Intrinsic Amplitude: 12 mV
Lead Channel Sensing Intrinsic Amplitude: 5 mV
Lead Channel Setting Pacing Amplitude: 2 V
Lead Channel Setting Pacing Amplitude: 2.5 V
Lead Channel Setting Pacing Pulse Width: 0.5 ms
Lead Channel Setting Sensing Sensitivity: 2 mV
Pulse Gen Model: 2272
Pulse Gen Serial Number: 3851870

## 2022-12-31 DIAGNOSIS — E782 Mixed hyperlipidemia: Secondary | ICD-10-CM | POA: Diagnosis not present

## 2022-12-31 DIAGNOSIS — E1140 Type 2 diabetes mellitus with diabetic neuropathy, unspecified: Secondary | ICD-10-CM | POA: Diagnosis not present

## 2023-01-01 LAB — LAB REPORT - SCANNED
A1c: 6.4
Albumin, Urine POC: 12
Creatinine, POC: 73 mg/dL
EGFR: 92
Microalb Creat Ratio: 16

## 2023-01-06 NOTE — Progress Notes (Signed)
Remote pacemaker transmission.   

## 2023-01-07 DIAGNOSIS — E875 Hyperkalemia: Secondary | ICD-10-CM | POA: Diagnosis not present

## 2023-01-07 DIAGNOSIS — R945 Abnormal results of liver function studies: Secondary | ICD-10-CM | POA: Diagnosis not present

## 2023-01-07 DIAGNOSIS — I480 Paroxysmal atrial fibrillation: Secondary | ICD-10-CM | POA: Diagnosis not present

## 2023-01-07 DIAGNOSIS — E782 Mixed hyperlipidemia: Secondary | ICD-10-CM | POA: Diagnosis not present

## 2023-01-07 DIAGNOSIS — M179 Osteoarthritis of knee, unspecified: Secondary | ICD-10-CM | POA: Diagnosis not present

## 2023-01-07 DIAGNOSIS — E1142 Type 2 diabetes mellitus with diabetic polyneuropathy: Secondary | ICD-10-CM | POA: Diagnosis not present

## 2023-01-07 DIAGNOSIS — E041 Nontoxic single thyroid nodule: Secondary | ICD-10-CM | POA: Diagnosis not present

## 2023-01-07 DIAGNOSIS — I495 Sick sinus syndrome: Secondary | ICD-10-CM | POA: Diagnosis not present

## 2023-01-07 DIAGNOSIS — E1169 Type 2 diabetes mellitus with other specified complication: Secondary | ICD-10-CM | POA: Diagnosis not present

## 2023-01-07 DIAGNOSIS — M5030 Other cervical disc degeneration, unspecified cervical region: Secondary | ICD-10-CM | POA: Diagnosis not present

## 2023-01-07 DIAGNOSIS — I10 Essential (primary) hypertension: Secondary | ICD-10-CM | POA: Diagnosis not present

## 2023-01-07 DIAGNOSIS — E1140 Type 2 diabetes mellitus with diabetic neuropathy, unspecified: Secondary | ICD-10-CM | POA: Diagnosis not present

## 2023-01-15 ENCOUNTER — Other Ambulatory Visit: Payer: Self-pay

## 2023-01-15 DIAGNOSIS — I480 Paroxysmal atrial fibrillation: Secondary | ICD-10-CM

## 2023-01-15 MED ORDER — APIXABAN 5 MG PO TABS: 5.0000 mg | ORAL_TABLET | Freq: Two times a day (BID) | ORAL | 5 refills | Status: DC

## 2023-01-15 NOTE — Telephone Encounter (Signed)
Prescription refill request for Eliquis received. Indication: Afib  Last office visit: 07/24/22 Ruben Lee)  Scr: 0.88 (12/31/22)  Age: 72 Weight: 98.9kg  Appropriate dose. Refill sent.

## 2023-03-17 ENCOUNTER — Ambulatory Visit (INDEPENDENT_AMBULATORY_CARE_PROVIDER_SITE_OTHER): Payer: PPO

## 2023-03-17 DIAGNOSIS — I495 Sick sinus syndrome: Secondary | ICD-10-CM

## 2023-03-17 LAB — CUP PACEART REMOTE DEVICE CHECK
Battery Remaining Longevity: 89 mo
Battery Remaining Percentage: 77 %
Battery Voltage: 3.01 V
Brady Statistic AP VP Percent: 1 %
Brady Statistic AP VS Percent: 47 %
Brady Statistic AS VP Percent: 1 %
Brady Statistic AS VS Percent: 52 %
Brady Statistic RA Percent Paced: 46 %
Brady Statistic RV Percent Paced: 1 %
Date Time Interrogation Session: 20240731061223
Implantable Lead Connection Status: 753985
Implantable Lead Connection Status: 753985
Implantable Lead Implant Date: 20210803
Implantable Lead Implant Date: 20210803
Implantable Lead Location: 753859
Implantable Lead Location: 753860
Implantable Pulse Generator Implant Date: 20210803
Lead Channel Impedance Value: 440 Ohm
Lead Channel Impedance Value: 480 Ohm
Lead Channel Pacing Threshold Amplitude: 0.5 V
Lead Channel Pacing Threshold Amplitude: 0.75 V
Lead Channel Pacing Threshold Pulse Width: 0.5 ms
Lead Channel Pacing Threshold Pulse Width: 0.5 ms
Lead Channel Sensing Intrinsic Amplitude: 12 mV
Lead Channel Sensing Intrinsic Amplitude: 5 mV
Lead Channel Setting Pacing Amplitude: 2 V
Lead Channel Setting Pacing Amplitude: 2.5 V
Lead Channel Setting Pacing Pulse Width: 0.5 ms
Lead Channel Setting Sensing Sensitivity: 2 mV
Pulse Gen Model: 2272
Pulse Gen Serial Number: 3851870

## 2023-03-31 NOTE — Progress Notes (Signed)
Remote pacemaker transmission.   

## 2023-04-27 DIAGNOSIS — Z7182 Exercise counseling: Secondary | ICD-10-CM | POA: Diagnosis not present

## 2023-04-27 DIAGNOSIS — I10 Essential (primary) hypertension: Secondary | ICD-10-CM | POA: Diagnosis not present

## 2023-04-27 DIAGNOSIS — Z713 Dietary counseling and surveillance: Secondary | ICD-10-CM | POA: Diagnosis not present

## 2023-04-27 DIAGNOSIS — Z79899 Other long term (current) drug therapy: Secondary | ICD-10-CM | POA: Diagnosis not present

## 2023-04-27 DIAGNOSIS — Z87891 Personal history of nicotine dependence: Secondary | ICD-10-CM | POA: Diagnosis not present

## 2023-04-27 DIAGNOSIS — M5442 Lumbago with sciatica, left side: Secondary | ICD-10-CM | POA: Diagnosis not present

## 2023-04-27 DIAGNOSIS — Z6827 Body mass index (BMI) 27.0-27.9, adult: Secondary | ICD-10-CM | POA: Diagnosis not present

## 2023-04-27 DIAGNOSIS — M5030 Other cervical disc degeneration, unspecified cervical region: Secondary | ICD-10-CM | POA: Diagnosis not present

## 2023-06-16 ENCOUNTER — Ambulatory Visit (INDEPENDENT_AMBULATORY_CARE_PROVIDER_SITE_OTHER): Payer: PPO

## 2023-06-16 DIAGNOSIS — I495 Sick sinus syndrome: Secondary | ICD-10-CM

## 2023-06-16 LAB — CUP PACEART REMOTE DEVICE CHECK
Battery Remaining Longevity: 86 mo
Battery Remaining Percentage: 75 %
Battery Voltage: 3.01 V
Brady Statistic AP VP Percent: 1 %
Brady Statistic AP VS Percent: 45 %
Brady Statistic AS VP Percent: 1 %
Brady Statistic AS VS Percent: 54 %
Brady Statistic RA Percent Paced: 44 %
Brady Statistic RV Percent Paced: 1 %
Date Time Interrogation Session: 20241030045239
Implantable Lead Connection Status: 753985
Implantable Lead Connection Status: 753985
Implantable Lead Implant Date: 20210803
Implantable Lead Implant Date: 20210803
Implantable Lead Location: 753859
Implantable Lead Location: 753860
Implantable Pulse Generator Implant Date: 20210803
Lead Channel Impedance Value: 410 Ohm
Lead Channel Impedance Value: 460 Ohm
Lead Channel Pacing Threshold Amplitude: 0.5 V
Lead Channel Pacing Threshold Amplitude: 0.75 V
Lead Channel Pacing Threshold Pulse Width: 0.5 ms
Lead Channel Pacing Threshold Pulse Width: 0.5 ms
Lead Channel Sensing Intrinsic Amplitude: 12 mV
Lead Channel Sensing Intrinsic Amplitude: 5 mV
Lead Channel Setting Pacing Amplitude: 2 V
Lead Channel Setting Pacing Amplitude: 2.5 V
Lead Channel Setting Pacing Pulse Width: 0.5 ms
Lead Channel Setting Sensing Sensitivity: 2 mV
Pulse Gen Model: 2272
Pulse Gen Serial Number: 3851870

## 2023-07-05 DIAGNOSIS — E782 Mixed hyperlipidemia: Secondary | ICD-10-CM | POA: Diagnosis not present

## 2023-07-05 DIAGNOSIS — Z125 Encounter for screening for malignant neoplasm of prostate: Secondary | ICD-10-CM | POA: Diagnosis not present

## 2023-07-05 DIAGNOSIS — E1169 Type 2 diabetes mellitus with other specified complication: Secondary | ICD-10-CM | POA: Diagnosis not present

## 2023-07-06 DIAGNOSIS — E119 Type 2 diabetes mellitus without complications: Secondary | ICD-10-CM | POA: Diagnosis not present

## 2023-07-06 LAB — LAB REPORT - SCANNED
A1c: 6.9
Albumin, Urine POC: 29.5
Creatinine, POC: 126.8 mg/dL
EGFR: 88
Microalb Creat Ratio: 23

## 2023-07-06 NOTE — Progress Notes (Signed)
Remote pacemaker transmission.   

## 2023-07-09 DIAGNOSIS — M5442 Lumbago with sciatica, left side: Secondary | ICD-10-CM | POA: Diagnosis not present

## 2023-07-09 DIAGNOSIS — E875 Hyperkalemia: Secondary | ICD-10-CM | POA: Diagnosis not present

## 2023-07-09 DIAGNOSIS — I495 Sick sinus syndrome: Secondary | ICD-10-CM | POA: Diagnosis not present

## 2023-07-09 DIAGNOSIS — M179 Osteoarthritis of knee, unspecified: Secondary | ICD-10-CM | POA: Diagnosis not present

## 2023-07-09 DIAGNOSIS — I10 Essential (primary) hypertension: Secondary | ICD-10-CM | POA: Diagnosis not present

## 2023-07-09 DIAGNOSIS — E782 Mixed hyperlipidemia: Secondary | ICD-10-CM | POA: Diagnosis not present

## 2023-07-09 DIAGNOSIS — E1142 Type 2 diabetes mellitus with diabetic polyneuropathy: Secondary | ICD-10-CM | POA: Diagnosis not present

## 2023-07-09 DIAGNOSIS — M5030 Other cervical disc degeneration, unspecified cervical region: Secondary | ICD-10-CM | POA: Diagnosis not present

## 2023-07-09 DIAGNOSIS — E041 Nontoxic single thyroid nodule: Secondary | ICD-10-CM | POA: Diagnosis not present

## 2023-07-09 DIAGNOSIS — E1169 Type 2 diabetes mellitus with other specified complication: Secondary | ICD-10-CM | POA: Diagnosis not present

## 2023-07-09 DIAGNOSIS — Z0001 Encounter for general adult medical examination with abnormal findings: Secondary | ICD-10-CM | POA: Diagnosis not present

## 2023-07-09 DIAGNOSIS — E1165 Type 2 diabetes mellitus with hyperglycemia: Secondary | ICD-10-CM | POA: Diagnosis not present

## 2023-07-19 ENCOUNTER — Other Ambulatory Visit (HOSPITAL_BASED_OUTPATIENT_CLINIC_OR_DEPARTMENT_OTHER): Payer: Self-pay | Admitting: Cardiovascular Disease

## 2023-07-19 DIAGNOSIS — I480 Paroxysmal atrial fibrillation: Secondary | ICD-10-CM

## 2023-07-19 NOTE — Telephone Encounter (Signed)
Prescription refill request for Eliquis received. Indication:afib Last office visit:12/23 Scr:0.92  11/24 Age: 72 Weight:98.9  kg  Prescription refilled

## 2023-07-26 ENCOUNTER — Encounter: Payer: PPO | Admitting: Cardiovascular Disease

## 2023-07-26 ENCOUNTER — Ambulatory Visit: Payer: PPO | Attending: Cardiovascular Disease | Admitting: Student

## 2023-07-26 ENCOUNTER — Encounter: Payer: Self-pay | Admitting: Student

## 2023-07-26 VITALS — BP 150/80 | HR 69 | Ht 75.0 in | Wt 222.0 lb

## 2023-07-26 DIAGNOSIS — I495 Sick sinus syndrome: Secondary | ICD-10-CM

## 2023-07-26 DIAGNOSIS — I10 Essential (primary) hypertension: Secondary | ICD-10-CM | POA: Diagnosis not present

## 2023-07-26 DIAGNOSIS — I480 Paroxysmal atrial fibrillation: Secondary | ICD-10-CM | POA: Diagnosis not present

## 2023-07-26 LAB — CUP PACEART INCLINIC DEVICE CHECK
Battery Remaining Longevity: 94 mo
Battery Voltage: 3.01 V
Brady Statistic RA Percent Paced: 43 %
Brady Statistic RV Percent Paced: 0.59 %
Date Time Interrogation Session: 20241209120617
Implantable Lead Connection Status: 753985
Implantable Lead Connection Status: 753985
Implantable Lead Implant Date: 20210803
Implantable Lead Implant Date: 20210803
Implantable Lead Location: 753859
Implantable Lead Location: 753860
Implantable Pulse Generator Implant Date: 20210803
Lead Channel Impedance Value: 437.5 Ohm
Lead Channel Impedance Value: 475 Ohm
Lead Channel Pacing Threshold Amplitude: 0.5 V
Lead Channel Pacing Threshold Amplitude: 0.75 V
Lead Channel Pacing Threshold Amplitude: 0.75 V
Lead Channel Pacing Threshold Pulse Width: 0.5 ms
Lead Channel Pacing Threshold Pulse Width: 0.5 ms
Lead Channel Pacing Threshold Pulse Width: 0.5 ms
Lead Channel Sensing Intrinsic Amplitude: 12 mV
Lead Channel Sensing Intrinsic Amplitude: 5 mV
Lead Channel Setting Pacing Amplitude: 2 V
Lead Channel Setting Pacing Amplitude: 2.5 V
Lead Channel Setting Pacing Pulse Width: 0.5 ms
Lead Channel Setting Sensing Sensitivity: 2 mV
Pulse Gen Model: 2272
Pulse Gen Serial Number: 3851870

## 2023-07-26 NOTE — Progress Notes (Signed)
  Electrophysiology Office Note:   ID:  [name], [name] 1951/02/06, MRN [id number]  Primary Cardiologist: None Electrophysiologist: Maurice Small, MD      History of Present Illness:   Ruben Lee is a 72 y.o. male with h/o paroxysmal AF, tachy-brady syndrome s/p PPM, and HTN seen today for routine electrophysiology followup.   Since last being seen in our clinic the patient reports doing very well. Brief, rare palpitations, but otherwise doing well. Overall,  he denies chest pain,  dyspnea, PND, orthopnea, nausea, vomiting, dizziness, syncope, edema, weight gain, or early satiety.   Review of systems complete and found to be negative unless listed in HPI.   EP Information / Studies Reviewed:    EKG is not ordered today. EKG from 01/28/2022 reviewed which showed NSR at 64 bpm       PPM Interrogation-  reviewed in detail today,  See PACEART report.  Device History: [employer] PPM implanted 03/2020 for SSS/Tachy-brady   Physical Exam:   VS:  BP (!) 150/80 (BP Location: Left Arm, Patient Position: Sitting, Cuff Size: Normal)   Pulse 69   Ht 6\' 3"  (1.905 m)   Wt 222 lb (100.7 kg)   SpO2 97%   BMI 27.75 kg/m    Wt Readings from Last 3 Encounters:  07/26/23 222 lb (100.7 kg)  07/24/22 218 lb (98.9 kg)  02/19/22 217 lb 9.5 oz (98.7 kg)     GEN: Well nourished, well developed in no acute distress NECK: No JVD; No carotid bruits CARDIAC: Regular rate and rhythm, no murmurs, rubs, gallops RESPIRATORY:  Clear to auscultation without rales, wheezing or rhonchi  ABDOMEN: Soft, non-tender, non-distended EXTREMITIES:  No edema; No deformity   ASSESSMENT AND PLAN:    Tachy-Brady syndrome s/p Abbott PPM  Normal PPM function See Pace Art report No changes today  PAF EKG today shows NSR Burden <1% by device Continue eliquis for CHA2DS2/VASc of at least 3  HTN Stable on current regimen     Disposition:   Follow up with Dr. Nelly Laurence in 12  months  Signed, Graciella Freer, PA-C

## 2023-07-26 NOTE — Patient Instructions (Signed)
Medication Instructions:  Your physician recommends that you continue on your current medications as directed. Please refer to the Current Medication list given to you today.  *If you need a refill on your cardiac medications before your next appointment, please call your pharmacy*  Lab Work: None ordered If you have labs (blood work) drawn today and your tests are completely normal, you will receive your results only by: MyChart Message (if you have MyChart) OR A paper copy in the mail If you have any lab test that is abnormal or we need to change your treatment, we will call you to review the results.  Follow-Up: At [facility], you and your health needs are our priority.  As part of our continuing mission to provide you with exceptional heart care, we have created designated Provider Care Teams.  These Care Teams include your primary Cardiologist (physician) and Advanced Practice Providers (APPs -  Physician Assistants and Nurse Practitioners) who all work together to provide you with the care you need, when you need it.  Your next appointment:   1 year(s)  Provider:   York Pellant, MD

## 2023-09-15 ENCOUNTER — Ambulatory Visit (INDEPENDENT_AMBULATORY_CARE_PROVIDER_SITE_OTHER): Payer: PPO

## 2023-09-15 DIAGNOSIS — I495 Sick sinus syndrome: Secondary | ICD-10-CM

## 2023-09-15 LAB — CUP PACEART REMOTE DEVICE CHECK
Battery Remaining Longevity: 83 mo
Battery Remaining Percentage: 73 %
Battery Voltage: 3.01 V
Brady Statistic AP VP Percent: 1 %
Brady Statistic AP VS Percent: 56 %
Brady Statistic AS VP Percent: 1 %
Brady Statistic AS VS Percent: 43 %
Brady Statistic RA Percent Paced: 56 %
Brady Statistic RV Percent Paced: 1 %
Date Time Interrogation Session: 20250129043303
Implantable Lead Connection Status: 753985
Implantable Lead Connection Status: 753985
Implantable Lead Implant Date: 20210803
Implantable Lead Implant Date: 20210803
Implantable Lead Location: 753859
Implantable Lead Location: 753860
Implantable Pulse Generator Implant Date: 20210803
Lead Channel Impedance Value: 440 Ohm
Lead Channel Impedance Value: 460 Ohm
Lead Channel Pacing Threshold Amplitude: 0.5 V
Lead Channel Pacing Threshold Amplitude: 0.75 V
Lead Channel Pacing Threshold Pulse Width: 0.5 ms
Lead Channel Pacing Threshold Pulse Width: 0.5 ms
Lead Channel Sensing Intrinsic Amplitude: 12 mV
Lead Channel Sensing Intrinsic Amplitude: 5 mV
Lead Channel Setting Pacing Amplitude: 2 V
Lead Channel Setting Pacing Amplitude: 2.5 V
Lead Channel Setting Pacing Pulse Width: 0.5 ms
Lead Channel Setting Sensing Sensitivity: 2 mV
Pulse Gen Model: 2272
Pulse Gen Serial Number: 3851870

## 2023-09-17 ENCOUNTER — Encounter: Payer: Self-pay | Admitting: Cardiovascular Disease

## 2023-10-25 NOTE — Progress Notes (Signed)
 Remote pacemaker transmission.

## 2023-10-29 DIAGNOSIS — E1169 Type 2 diabetes mellitus with other specified complication: Secondary | ICD-10-CM | POA: Diagnosis not present

## 2023-10-29 DIAGNOSIS — E782 Mixed hyperlipidemia: Secondary | ICD-10-CM | POA: Diagnosis not present

## 2023-11-04 DIAGNOSIS — M5030 Other cervical disc degeneration, unspecified cervical region: Secondary | ICD-10-CM | POA: Diagnosis not present

## 2023-11-04 DIAGNOSIS — R749 Abnormal serum enzyme level, unspecified: Secondary | ICD-10-CM | POA: Diagnosis not present

## 2023-11-04 DIAGNOSIS — I495 Sick sinus syndrome: Secondary | ICD-10-CM | POA: Diagnosis not present

## 2023-11-04 DIAGNOSIS — E875 Hyperkalemia: Secondary | ICD-10-CM | POA: Diagnosis not present

## 2023-11-04 DIAGNOSIS — E1169 Type 2 diabetes mellitus with other specified complication: Secondary | ICD-10-CM | POA: Diagnosis not present

## 2023-11-04 DIAGNOSIS — E782 Mixed hyperlipidemia: Secondary | ICD-10-CM | POA: Diagnosis not present

## 2023-11-04 DIAGNOSIS — M5442 Lumbago with sciatica, left side: Secondary | ICD-10-CM | POA: Diagnosis not present

## 2023-11-04 DIAGNOSIS — M179 Osteoarthritis of knee, unspecified: Secondary | ICD-10-CM | POA: Diagnosis not present

## 2023-11-04 DIAGNOSIS — G43B0 Ophthalmoplegic migraine, not intractable: Secondary | ICD-10-CM | POA: Diagnosis not present

## 2023-11-04 DIAGNOSIS — E1142 Type 2 diabetes mellitus with diabetic polyneuropathy: Secondary | ICD-10-CM | POA: Diagnosis not present

## 2023-11-04 DIAGNOSIS — I480 Paroxysmal atrial fibrillation: Secondary | ICD-10-CM | POA: Diagnosis not present

## 2023-11-04 DIAGNOSIS — I10 Essential (primary) hypertension: Secondary | ICD-10-CM | POA: Diagnosis not present

## 2023-12-15 ENCOUNTER — Ambulatory Visit: Payer: PPO

## 2023-12-15 DIAGNOSIS — I495 Sick sinus syndrome: Secondary | ICD-10-CM

## 2023-12-16 LAB — CUP PACEART REMOTE DEVICE CHECK
Battery Remaining Longevity: 80 mo
Battery Remaining Percentage: 71 %
Battery Voltage: 3.01 V
Brady Statistic AP VP Percent: 1 %
Brady Statistic AP VS Percent: 56 %
Brady Statistic AS VP Percent: 1 %
Brady Statistic AS VS Percent: 43 %
Brady Statistic RA Percent Paced: 56 %
Brady Statistic RV Percent Paced: 1 %
Date Time Interrogation Session: 20250430020013
Implantable Lead Connection Status: 753985
Implantable Lead Connection Status: 753985
Implantable Lead Implant Date: 20210803
Implantable Lead Implant Date: 20210803
Implantable Lead Location: 753859
Implantable Lead Location: 753860
Implantable Pulse Generator Implant Date: 20210803
Lead Channel Impedance Value: 410 Ohm
Lead Channel Impedance Value: 450 Ohm
Lead Channel Pacing Threshold Amplitude: 0.5 V
Lead Channel Pacing Threshold Amplitude: 0.75 V
Lead Channel Pacing Threshold Pulse Width: 0.5 ms
Lead Channel Pacing Threshold Pulse Width: 0.5 ms
Lead Channel Sensing Intrinsic Amplitude: 12 mV
Lead Channel Sensing Intrinsic Amplitude: 5 mV
Lead Channel Setting Pacing Amplitude: 2 V
Lead Channel Setting Pacing Amplitude: 2.5 V
Lead Channel Setting Pacing Pulse Width: 0.5 ms
Lead Channel Setting Sensing Sensitivity: 2 mV
Pulse Gen Model: 2272
Pulse Gen Serial Number: 3851870

## 2023-12-27 ENCOUNTER — Encounter: Payer: Self-pay | Admitting: Cardiovascular Disease

## 2024-01-07 ENCOUNTER — Other Ambulatory Visit: Payer: Self-pay | Admitting: *Deleted

## 2024-01-07 DIAGNOSIS — I480 Paroxysmal atrial fibrillation: Secondary | ICD-10-CM

## 2024-01-07 MED ORDER — APIXABAN 5 MG PO TABS: 5.0000 mg | ORAL_TABLET | Freq: Two times a day (BID) | ORAL | 5 refills | Status: DC

## 2024-01-07 NOTE — Telephone Encounter (Signed)
 Eliquis  5mg  refill request received. Patient is 73 years old, weight-100.7kg, Crea-0.93 on 10/30/23 via [facility] tab, Diagnosis-Afib, and last seen by Michaelle Adolphus on 07/26/23. Dose is appropriate based on dosing criteria. Will send in refill to requested pharmacy.

## 2024-01-27 NOTE — Addendum Note (Signed)
 Addended by: Lott Rouleau A on: 01/27/2024 02:35 PM   Modules accepted: Orders

## 2024-01-27 NOTE — Progress Notes (Signed)
 Remote pacemaker transmission.

## 2024-02-23 DIAGNOSIS — E782 Mixed hyperlipidemia: Secondary | ICD-10-CM | POA: Diagnosis not present

## 2024-02-23 DIAGNOSIS — E1169 Type 2 diabetes mellitus with other specified complication: Secondary | ICD-10-CM | POA: Diagnosis not present

## 2024-02-24 LAB — LAB REPORT - SCANNED
A1c: 6
Albumin, Urine POC: 38.9
Creatinine, POC: 122.8 mg/dL
EGFR: 82
Microalb Creat Ratio: 32

## 2024-03-01 ENCOUNTER — Encounter: Payer: Self-pay | Admitting: Internal Medicine

## 2024-03-01 DIAGNOSIS — M5442 Lumbago with sciatica, left side: Secondary | ICD-10-CM | POA: Diagnosis not present

## 2024-03-01 DIAGNOSIS — E041 Nontoxic single thyroid nodule: Secondary | ICD-10-CM | POA: Diagnosis not present

## 2024-03-01 DIAGNOSIS — E1142 Type 2 diabetes mellitus with diabetic polyneuropathy: Secondary | ICD-10-CM | POA: Diagnosis not present

## 2024-03-01 DIAGNOSIS — I495 Sick sinus syndrome: Secondary | ICD-10-CM | POA: Diagnosis not present

## 2024-03-01 DIAGNOSIS — E875 Hyperkalemia: Secondary | ICD-10-CM | POA: Diagnosis not present

## 2024-03-01 DIAGNOSIS — M5030 Other cervical disc degeneration, unspecified cervical region: Secondary | ICD-10-CM | POA: Diagnosis not present

## 2024-03-01 DIAGNOSIS — I480 Paroxysmal atrial fibrillation: Secondary | ICD-10-CM | POA: Diagnosis not present

## 2024-03-01 DIAGNOSIS — E1169 Type 2 diabetes mellitus with other specified complication: Secondary | ICD-10-CM | POA: Diagnosis not present

## 2024-03-01 DIAGNOSIS — M179 Osteoarthritis of knee, unspecified: Secondary | ICD-10-CM | POA: Diagnosis not present

## 2024-03-01 DIAGNOSIS — E782 Mixed hyperlipidemia: Secondary | ICD-10-CM | POA: Diagnosis not present

## 2024-03-01 DIAGNOSIS — D6949 Other primary thrombocytopenia: Secondary | ICD-10-CM | POA: Diagnosis not present

## 2024-03-01 DIAGNOSIS — I10 Essential (primary) hypertension: Secondary | ICD-10-CM | POA: Diagnosis not present

## 2024-03-15 ENCOUNTER — Ambulatory Visit

## 2024-03-15 DIAGNOSIS — I495 Sick sinus syndrome: Secondary | ICD-10-CM | POA: Diagnosis not present

## 2024-03-15 LAB — CUP PACEART REMOTE DEVICE CHECK
Battery Remaining Longevity: 77 mo
Battery Remaining Percentage: 69 %
Battery Voltage: 2.99 V
Brady Statistic AP VP Percent: 1 %
Brady Statistic AP VS Percent: 54 %
Brady Statistic AS VP Percent: 1 %
Brady Statistic AS VS Percent: 45 %
Brady Statistic RA Percent Paced: 54 %
Brady Statistic RV Percent Paced: 1 %
Date Time Interrogation Session: 20250730020013
Implantable Lead Connection Status: 753985
Implantable Lead Connection Status: 753985
Implantable Lead Implant Date: 20210803
Implantable Lead Implant Date: 20210803
Implantable Lead Location: 753859
Implantable Lead Location: 753860
Implantable Pulse Generator Implant Date: 20210803
Lead Channel Impedance Value: 400 Ohm
Lead Channel Impedance Value: 460 Ohm
Lead Channel Pacing Threshold Amplitude: 0.5 V
Lead Channel Pacing Threshold Amplitude: 0.75 V
Lead Channel Pacing Threshold Pulse Width: 0.5 ms
Lead Channel Pacing Threshold Pulse Width: 0.5 ms
Lead Channel Sensing Intrinsic Amplitude: 12 mV
Lead Channel Sensing Intrinsic Amplitude: 5 mV
Lead Channel Setting Pacing Amplitude: 2 V
Lead Channel Setting Pacing Amplitude: 2.5 V
Lead Channel Setting Pacing Pulse Width: 0.5 ms
Lead Channel Setting Sensing Sensitivity: 2 mV
Pulse Gen Model: 2272
Pulse Gen Serial Number: 3851870

## 2024-03-27 ENCOUNTER — Ambulatory Visit: Payer: Self-pay | Admitting: Cardiovascular Disease

## 2024-05-17 NOTE — Progress Notes (Signed)
 Remote PPM Transmission

## 2024-06-14 ENCOUNTER — Ambulatory Visit

## 2024-06-14 DIAGNOSIS — I495 Sick sinus syndrome: Secondary | ICD-10-CM

## 2024-06-15 LAB — CUP PACEART REMOTE DEVICE CHECK
Battery Remaining Longevity: 74 mo
Battery Remaining Percentage: 66 %
Battery Voltage: 3.01 V
Brady Statistic AP VP Percent: 1 %
Brady Statistic AP VS Percent: 55 %
Brady Statistic AS VP Percent: 1 %
Brady Statistic AS VS Percent: 43 %
Brady Statistic RA Percent Paced: 55 %
Brady Statistic RV Percent Paced: 1.1 %
Date Time Interrogation Session: 20251029174212
Implantable Lead Connection Status: 753985
Implantable Lead Connection Status: 753985
Implantable Lead Implant Date: 20210803
Implantable Lead Implant Date: 20210803
Implantable Lead Location: 753859
Implantable Lead Location: 753860
Implantable Pulse Generator Implant Date: 20210803
Lead Channel Impedance Value: 400 Ohm
Lead Channel Impedance Value: 450 Ohm
Lead Channel Pacing Threshold Amplitude: 0.5 V
Lead Channel Pacing Threshold Amplitude: 0.75 V
Lead Channel Pacing Threshold Pulse Width: 0.5 ms
Lead Channel Pacing Threshold Pulse Width: 0.5 ms
Lead Channel Sensing Intrinsic Amplitude: 12 mV
Lead Channel Sensing Intrinsic Amplitude: 5 mV
Lead Channel Setting Pacing Amplitude: 2 V
Lead Channel Setting Pacing Amplitude: 2.5 V
Lead Channel Setting Pacing Pulse Width: 0.5 ms
Lead Channel Setting Sensing Sensitivity: 2 mV
Pulse Gen Model: 2272
Pulse Gen Serial Number: 3851870

## 2024-06-21 NOTE — Progress Notes (Signed)
 Remote PPM Transmission

## 2024-06-22 ENCOUNTER — Ambulatory Visit: Payer: Self-pay | Admitting: Cardiovascular Disease

## 2024-07-05 DIAGNOSIS — E119 Type 2 diabetes mellitus without complications: Secondary | ICD-10-CM | POA: Diagnosis not present

## 2024-07-09 ENCOUNTER — Other Ambulatory Visit: Payer: Self-pay | Admitting: Cardiovascular Disease

## 2024-07-09 DIAGNOSIS — I480 Paroxysmal atrial fibrillation: Secondary | ICD-10-CM

## 2024-07-10 NOTE — Telephone Encounter (Signed)
 Eliquis  5mg  refill request received. Patient is 73 years old, weight-100.7kg, Crea-0.98 on 02/23/24 via scanned labs from PCP, Diagnosis-Afib, and last seen by Jodie Passey on 07/26/23 and pending appt with Der. Mealor on 09/08/24. Dose is appropriate based on dosing criteria. Will send in refill to requested pharmacy.

## 2024-07-27 DIAGNOSIS — E782 Mixed hyperlipidemia: Secondary | ICD-10-CM | POA: Diagnosis not present

## 2024-07-27 DIAGNOSIS — E1169 Type 2 diabetes mellitus with other specified complication: Secondary | ICD-10-CM | POA: Diagnosis not present

## 2024-07-27 DIAGNOSIS — Z125 Encounter for screening for malignant neoplasm of prostate: Secondary | ICD-10-CM | POA: Diagnosis not present

## 2024-07-28 LAB — LAB REPORT - SCANNED
A1c: 6.3
Albumin, Urine POC: 12.6
Albumin/Creatinine Ratio, Urine, POC: 17
Creatinine, POC: 74.1 mg/dL
EGFR: 87

## 2024-09-08 ENCOUNTER — Ambulatory Visit: Admitting: Cardiovascular Disease

## 2024-09-13 ENCOUNTER — Ambulatory Visit

## 2024-09-13 DIAGNOSIS — I495 Sick sinus syndrome: Secondary | ICD-10-CM

## 2024-09-13 LAB — CUP PACEART REMOTE DEVICE CHECK
Battery Remaining Longevity: 72 mo
Battery Remaining Percentage: 64 %
Battery Voltage: 2.99 V
Brady Statistic AP VP Percent: 1 %
Brady Statistic AP VS Percent: 56 %
Brady Statistic AS VP Percent: 1 %
Brady Statistic AS VS Percent: 43 %
Brady Statistic RA Percent Paced: 56 %
Brady Statistic RV Percent Paced: 1 %
Date Time Interrogation Session: 20260128032234
Implantable Lead Connection Status: 753985
Implantable Lead Connection Status: 753985
Implantable Lead Implant Date: 20210803
Implantable Lead Implant Date: 20210803
Implantable Lead Location: 753859
Implantable Lead Location: 753860
Implantable Pulse Generator Implant Date: 20210803
Lead Channel Impedance Value: 400 Ohm
Lead Channel Impedance Value: 460 Ohm
Lead Channel Pacing Threshold Amplitude: 0.5 V
Lead Channel Pacing Threshold Amplitude: 0.75 V
Lead Channel Pacing Threshold Pulse Width: 0.5 ms
Lead Channel Pacing Threshold Pulse Width: 0.5 ms
Lead Channel Sensing Intrinsic Amplitude: 12 mV
Lead Channel Sensing Intrinsic Amplitude: 5 mV
Lead Channel Setting Pacing Amplitude: 2 V
Lead Channel Setting Pacing Amplitude: 2.5 V
Lead Channel Setting Pacing Pulse Width: 0.5 ms
Lead Channel Setting Sensing Sensitivity: 2 mV
Pulse Gen Model: 2272
Pulse Gen Serial Number: 3851870

## 2024-09-14 ENCOUNTER — Ambulatory Visit: Payer: Self-pay | Admitting: Cardiovascular Disease

## 2024-09-21 NOTE — Progress Notes (Signed)
 Remote PPM Transmission

## 2024-10-20 ENCOUNTER — Ambulatory Visit: Admitting: Cardiovascular Disease

## 2024-12-13 ENCOUNTER — Ambulatory Visit

## 2025-03-14 ENCOUNTER — Ambulatory Visit
